# Patient Record
Sex: Male | Born: 1950 | Race: Black or African American | Hispanic: No | Marital: Married | Smoking: Never smoker
Health system: Southern US, Community
[De-identification: ages and names within clinical notes are randomized; demographics above are authoritative.]

## PROBLEM LIST (undated history)

## (undated) DIAGNOSIS — I10 Essential (primary) hypertension: Secondary | ICD-10-CM

---

## 2013-12-03 ENCOUNTER — Observation Stay: Payer: Self-pay | Admitting: Surgery

## 2013-12-03 LAB — COMPREHENSIVE METABOLIC PANEL
ALBUMIN: 3.4 g/dL (ref 3.4–5.0)
ALK PHOS: 75 U/L
ALT: 32 U/L (ref 12–78)
ANION GAP: 7 (ref 7–16)
AST: 42 U/L — AB (ref 15–37)
BILIRUBIN TOTAL: 0.8 mg/dL (ref 0.2–1.0)
BUN: 17 mg/dL (ref 7–18)
CALCIUM: 9.4 mg/dL (ref 8.5–10.1)
CHLORIDE: 95 mmol/L — AB (ref 98–107)
CREATININE: 1.27 mg/dL (ref 0.60–1.30)
Co2: 29 mmol/L (ref 21–32)
EGFR (African American): >60
EGFR (Non-African Amer.): >60
Glucose: 114 mg/dL — ABNORMAL HIGH (ref 65–99)
OSMOLALITY: 265 (ref 275–301)
POTASSIUM: 3.6 mmol/L (ref 3.5–5.1)
Sodium: 131 mmol/L — ABNORMAL LOW (ref 136–145)
TOTAL PROTEIN: 9.4 g/dL — AB (ref 6.4–8.2)

## 2013-12-03 LAB — CBC WITH DIFFERENTIAL/PLATELET
BASOS PCT: 0.7 %
Basophil #: 0.1 10*3/uL (ref 0.0–0.1)
EOS ABS: 0.1 10*3/uL (ref 0.0–0.7)
Eosinophil %: 0.4 %
HCT: 44.4 % (ref 40.0–52.0)
HGB: 14.9 g/dL (ref 13.0–18.0)
Lymphocyte #: 2.4 10*3/uL (ref 1.0–3.6)
Lymphocyte %: 15.9 %
MCH: 31.4 pg (ref 26.0–34.0)
MCHC: 33.5 g/dL (ref 32.0–36.0)
MCV: 94 fL (ref 80–100)
Monocyte #: 1.2 x10 3/mm — ABNORMAL HIGH (ref 0.2–1.0)
Monocyte %: 7.9 %
NEUTROS PCT: 75.1 %
Neutrophil #: 11.4 10*3/uL — ABNORMAL HIGH (ref 1.4–6.5)
PLATELETS: 194 10*3/uL (ref 150–440)
RBC: 4.73 10*6/uL (ref 4.40–5.90)
RDW: 12.8 % (ref 11.5–14.5)
WBC: 15.2 10*3/uL — ABNORMAL HIGH (ref 3.8–10.6)

## 2013-12-03 LAB — URINALYSIS, COMPLETE
Bilirubin,UR: NEGATIVE
GLUCOSE, UR: NEGATIVE mg/dL (ref 0–75)
KETONE: NEGATIVE
Leukocyte Esterase: NEGATIVE
NITRITE: NEGATIVE
Ph: 6 (ref 4.5–8.0)
RBC, UR: NONE SEEN /HPF (ref 0–5)
SPECIFIC GRAVITY: 1.015 (ref 1.003–1.030)

## 2013-12-03 LAB — LIPASE, BLOOD: LIPASE: 112 U/L (ref 73–393)

## 2013-12-04 LAB — BASIC METABOLIC PANEL
Anion Gap: 7 (ref 7–16)
BUN: 15 mg/dL (ref 7–18)
CALCIUM: 8.8 mg/dL (ref 8.5–10.1)
Chloride: 93 mmol/L — ABNORMAL LOW (ref 98–107)
Co2: 30 mmol/L (ref 21–32)
Creatinine: 1.19 mg/dL (ref 0.60–1.30)
EGFR (African American): >60
Glucose: 140 mg/dL — ABNORMAL HIGH (ref 65–99)
Osmolality: 264 (ref 275–301)
Potassium: 4.3 mmol/L (ref 3.5–5.1)
Sodium: 130 mmol/L — ABNORMAL LOW (ref 136–145)

## 2013-12-04 LAB — HEPATIC FUNCTION PANEL A (ARMC)
ALK PHOS: 67 U/L
ALT: 33 U/L (ref 12–78)
AST: 39 U/L — AB (ref 15–37)
Albumin: 2.9 g/dL — ABNORMAL LOW (ref 3.4–5.0)
BILIRUBIN DIRECT: 0.2 mg/dL (ref 0.00–0.20)
Bilirubin,Total: 0.8 mg/dL (ref 0.2–1.0)
Total Protein: 8.4 g/dL — ABNORMAL HIGH (ref 6.4–8.2)

## 2013-12-04 LAB — CBC WITH DIFFERENTIAL/PLATELET
BASOS PCT: 0.4 %
Basophil #: 0.1 10*3/uL (ref 0.0–0.1)
Eosinophil #: 0 10*3/uL (ref 0.0–0.7)
Eosinophil %: 0.2 %
HCT: 40 % (ref 40.0–52.0)
HGB: 13.4 g/dL (ref 13.0–18.0)
Lymphocyte #: 1.6 10*3/uL (ref 1.0–3.6)
Lymphocyte %: 12.8 %
MCH: 31.6 pg (ref 26.0–34.0)
MCHC: 33.6 g/dL (ref 32.0–36.0)
MCV: 94 fL (ref 80–100)
Monocyte #: 1.1 x10 3/mm — ABNORMAL HIGH (ref 0.2–1.0)
Monocyte %: 8.7 %
NEUTROS ABS: 9.8 10*3/uL — AB (ref 1.4–6.5)
NEUTROS PCT: 77.9 %
PLATELETS: 193 10*3/uL (ref 150–440)
RBC: 4.26 10*6/uL — ABNORMAL LOW (ref 4.40–5.90)
RDW: 12.9 % (ref 11.5–14.5)
WBC: 12.6 10*3/uL — AB (ref 3.8–10.6)

## 2013-12-04 LAB — LIPASE, BLOOD: Lipase: 91 U/L (ref 73–393)

## 2013-12-05 LAB — CBC WITH DIFFERENTIAL/PLATELET
Basophil #: 0 10*3/uL (ref 0.0–0.1)
Basophil %: 0.3 %
EOS PCT: 1 %
Eosinophil #: 0.1 10*3/uL (ref 0.0–0.7)
HCT: 37 % — ABNORMAL LOW (ref 40.0–52.0)
HGB: 12.6 g/dL — AB (ref 13.0–18.0)
LYMPHS ABS: 1.7 10*3/uL (ref 1.0–3.6)
Lymphocyte %: 18.2 %
MCH: 31.9 pg (ref 26.0–34.0)
MCHC: 33.9 g/dL (ref 32.0–36.0)
MCV: 94 fL (ref 80–100)
MONOS PCT: 7.4 %
Monocyte #: 0.7 x10 3/mm (ref 0.2–1.0)
NEUTROS ABS: 6.7 10*3/uL — AB (ref 1.4–6.5)
Neutrophil %: 73.1 %
Platelet: 202 10*3/uL (ref 150–440)
RBC: 3.94 10*6/uL — ABNORMAL LOW (ref 4.40–5.90)
RDW: 12.8 % (ref 11.5–14.5)
WBC: 9.2 10*3/uL (ref 3.8–10.6)

## 2013-12-05 LAB — BASIC METABOLIC PANEL
ANION GAP: 5 — AB (ref 7–16)
BUN: 12 mg/dL (ref 7–18)
CALCIUM: 8.4 mg/dL — AB (ref 8.5–10.1)
CO2: 30 mmol/L (ref 21–32)
Chloride: 94 mmol/L — ABNORMAL LOW (ref 98–107)
Creatinine: 1.01 mg/dL (ref 0.60–1.30)
EGFR (African American): >60
EGFR (Non-African Amer.): >60
GLUCOSE: 128 mg/dL — AB (ref 65–99)
OSMOLALITY: 260 (ref 275–301)
POTASSIUM: 4 mmol/L (ref 3.5–5.1)
Sodium: 129 mmol/L — ABNORMAL LOW (ref 136–145)

## 2013-12-05 LAB — HEPATIC FUNCTION PANEL A (ARMC)
Albumin: 2.6 g/dL — ABNORMAL LOW (ref 3.4–5.0)
Alkaline Phosphatase: 60 U/L
Bilirubin, Direct: 0.3 mg/dL — ABNORMAL HIGH (ref 0.00–0.20)
Bilirubin,Total: 0.9 mg/dL (ref 0.2–1.0)
SGOT(AST): 61 U/L — ABNORMAL HIGH (ref 15–37)
SGPT (ALT): 51 U/L (ref 12–78)
Total Protein: 7.9 g/dL (ref 6.4–8.2)

## 2013-12-07 LAB — PATHOLOGY REPORT

## 2014-11-16 NOTE — Op Note (Signed)
PATIENT NAME:  [name], Miguel Guzman DATE OF BIRTH:  [birth year]  DATE OF PROCEDURE:  12/04/2013  PREOPERATIVE DIAGNOSIS: Acute cholecystitis.   POSTOPERATIVE DIAGNOSIS: Acute cholecystitis.   PROCEDURE: Laparoscopic cholecystectomy.   SURGEON: Dr. Quentin Orealph L. Ely.   ANESTHESIA: General.   OPERATIVE PROCEDURE: With the patient in the supine position, after induction of appropriate general anesthesia, the patient's abdomen was prepped with ChloraPrep and draped in sterile towels. The patient was placed in head down, feet up position. A small infraumbilical incision was made in a standard fashion, carried down bluntly through subcutaneous tissue. A Veress needle was used to cannulate the peritoneal cavity. CO2 was insufflated to appropriate pressure measurements. When approximately 2.5 liters of CO2 were instilled, the Veress needle was withdrawn. An 11 mm [employer] port inserted the peritoneal cavity. Intraperitoneal position was confirmed. CO2 was reinsufflated. The patient was placed in the head up, feet down position and rotated slightly to the left side. A subxiphoid transverse incision was made and an 11 mm port inserted under direct vision. Two lateral ports, 5 mm in size, were inserted under direct vision. The gallbladder was partially intrahepatic obviously significantly inflamed with evidence of patchy necrosis. The gallbladder was aspirated of approximately 30 mL to 40 mL of clear bile. The gallbladder was retracted superiorly and laterally exposing the hepatoduodenal ligament. Significant difficulty was encountered with the dissection, but the hepatoduodenal ligament was eventually exposed. The gallbladder extracted laterally and superiorly from the liver exposing the cystic duct. A window was created behind the duct. However, the duct was larger than the millimeter clip. For that reason, the subxiphoid port was exchanged for a 12 mm port and an Endo GIA stapling device inserted  through that port. The stapler was passed behind the duct and stapler then fired. The duct was divided without difficulty. The gallbladder was then dissected free with the hook and cautery apparatus. Once the gallbladder was free, it was captured in the Endo Catch apparatus and removed through the subxiphoid port. The area was copiously irrigated. A drain was placed through the midline port and brought out through one of the lateral stab wounds using a 19-French Blake drain. The drain was secured with 3-0 nylon. The abdomen was then desufflated after irrigation. The upper abdominal incision was closed through the fascia prior to desufflation using suture passer and 0 Vicryl and figure-of-eight suture.  The skin was closed with 5-0 nylon.  The area was infiltrated with 0.25% Marcaine for postoperative pain control.  Sterile dressings were applied.  The patient returned to the recovery room having tolerated the procedure well. Sponge, instrument and needle counts were correct x 2 in the operating room.   ____________________________ Quentin Orealph L. Ely III, MD rle:ce D: 12/04/2013 15:35:23 ET T: 12/04/2013 18:07:11 ET JOB#: [id number]  cc: Quentin Orealph L. Ely III, MD, <Dictator> Quentin OreALPH L ELY MD ELECTRONICALLY SIGNED 12/12/2013 0:09

## 2014-11-16 NOTE — Discharge Summary (Signed)
PATIENT NAME:  Miguel Guzman, Miguel Guzman MR#:  [id number] DATE OF BIRTH:  July 23, 1951  DATE OF ADMISSION:  12/03/2013 DATE OF DISCHARGE:  12/05/2013  BRIEF HISTORY: Miguel Guzman is a 64 year old gentleman admitted through the Emergency Room with signs and symptoms consistent with acute cholecystitis. He had been in his usual state of good health until approximately 4 days prior to admission. He had had long-standing intra-abdominal problems and intermittently evaluated for gastric issues, but this current attack was much more severe than he identified in the past. He was referred by his primary care physician to the ED for further evaluation. Workup in the Emergency Room suggested probable acute cholecystitis. Gallbladder ultrasound demonstrated mild gallbladder wall thickening, a possible impacted cystic duct stone. He was admitted to the hospital, placed on IV antibiotics. After appropriate preoperative preparation and informed consent, he was taken to surgery the following morning where he underwent a laparoscopic cholecystectomy. The gallbladder was acutely inflamed with severe inflammatory change around the porta hepatis. A ductal cholangiogram was not possible. A drain was placed and the gallbladder removed with some difficulty. Postoperatively, he had no significant postoperative problems. He was up ambulating the following morning, tolerating a regular diet. He was discharged home on the 13th to be followed in the office in 7 to 10 days' time. Bathing, activity and driving instructions were given to the patient.   DISCHARGE MEDICATIONS: He is to take Carafate 1 g 4 times a day p.r.n. and Zofran. He was discharged home on Vicodin 5/325 every 4 to 6 hours p.r.n. pain.   FINAL DISCHARGE DIAGNOSIS: Acute cholecystitis.   SURGERY: Laparoscopic cholecystectomy.   ____________________________ Miguel Orealph L. Ely III, MD rle:gb D: 12/12/2013 00:03:40 ET T: 12/12/2013 00:17:10 ET JOB#: [id number]  cc: Carmie Endalph L. Ely III,  MD, <Dictator> Marisue IvanKanhka Linthavong, MD Miguel OreALPH L ELY MD ELECTRONICALLY SIGNED 12/12/2013 20:09

## 2014-11-16 NOTE — H&P (Signed)
PATIENT NAME:  Miguel KinsmanNSLEY, Kazim MR#:  [id number] DATE OF BIRTH:  04-12-1951  DATE OF ADMISSION:  12/03/2013  PRIMARY CARE PHYSICIAN: Dr. Burnadette PopLinthavong.   ADMITTING PHYSICIAN: Dr. Michela PitcherEly.   CHIEF COMPLAINT: Abdominal pain, nausea and vomiting.   BRIEF HISTORY: Miguel Guzman is a 64 year old gentleman seen in the Emergency Room with a 4-day history of significant abdominal discomfort. He has had long-standing abdominal problems and has been intermittently evaluated by his primary care physician. He was treated for "gastric problems" with multiple medications over the last several years. He has had intermittent attacks every 3 to 4 weeks, which last a day or 2, accompanied by abdominal pain, nausea and vomiting. The most recent attack began last week with severe midepigastric, right upper quadrant pain, progressed to nausea and fever. The pain was much worse than he had seen in the past. He called his primary care physician and was referred to the acute care center. He was seen in acute care, treated with GI cocktail without any improvement. He took some codeine at home and did get some improvement in his symptoms. The pain did not improve over the course of the week, and he presented to the Emergency Room today for further evaluation.   He has no other significant GI history other than that noted above. He denies a history of hepatitis, yellow jaundice, pancreatitis, peptic ulcer disease, previous diagnosis of gallbladder disease or diverticulitis. He has had no previous abdominal surgery. He has had a colonoscopy but not an upper endoscopy. He denies any history of cardiac disease, hypertension, diabetes or thyroid problems.   He takes no medications regularly.  HE IS ALLERGIC TO PENICILLIN WHICH CAUSES A RASH.   He is not a cigarette smoker. Does not drink any alcohol.   REVIEW OF SYSTEMS: Otherwise unremarkable.   PHYSICAL EXAMINATION:   GENERAL: He is an alert, pleasant gentleman in obvious  discomfort. VITAL SIGNS: Reveal a blood pressure of 190/87, temperature of 99.2, heart rate of 100. Pain scale of 10.  HEENT: No scleral icterus. No pupillary abnormalities. No facial deformities.  NECK: Supple, nontender with midline trachea. No adenopathy.  CHEST: Clear with no adventitious sounds, and he has normal pulmonary excursion.  CARDIAC: Reveals no murmurs or gallops to my ear. He seems to be in normal sinus rhythm.  ABDOMEN: Soft with some mild right upper quadrant and midepigastric tenderness. He has active bowel sounds. No masses or hernias are noted. EXTREMITIES: Full range of motion, no deformities.  PSYCHIATRIC: Normal orientation, normal affect.  LABORATORY AND RADIOLOGICAL DATA: Laboratory evaluation in the Emergency Room was largely unremarkable. He has a slightly elevated white blood cell count at 15,200. His liver function studies were unremarkable. He was slightly dehydrated with a creatinine of 1.27 and a BUN of 17. Ultrasound demonstrated mild gallbladder wall thickening, possible pericholecystic fluid, and a possible impacted cystic duct stone. Surgical service was consulted.  IMPRESSION AND PLAN: This gentleman has biliary colic, possible acute cholecystitis. Will plan to admit him to the hospital, put him on IV rehydration, antibiotics, pain control, and tentatively plan surgery for tomorrow morning. We discussed this plan with him in detail, and he is in agreement.    ____________________________ Carmie Endalph L. Ely III, MD rle:jcm D: 12/03/2013 14:44:56 ET T: 12/03/2013 15:04:09 ET JOB#: [id number]  cc: Quentin Orealph L. Ely III, MD, <Dictator> Marisue IvanKanhka Linthavong, MD Quentin OreALPH L ELY MD ELECTRONICALLY SIGNED 12/12/2013 0:09

## 2021-08-13 ENCOUNTER — Other Ambulatory Visit: Payer: Self-pay | Admitting: Family Medicine

## 2021-08-13 DIAGNOSIS — I119 Hypertensive heart disease without heart failure: Secondary | ICD-10-CM

## 2021-08-20 ENCOUNTER — Ambulatory Visit
Admission: RE | Admit: 2021-08-20 | Discharge: 2021-08-20 | Disposition: A | Discharge status: Home / Self Care | Payer: No Typology Code available for payment source | Source: Ambulatory Visit | Attending: Family Medicine | Admitting: Family Medicine

## 2021-08-20 DIAGNOSIS — Z87891 Personal history of nicotine dependence: Secondary | ICD-10-CM | POA: Diagnosis not present

## 2021-08-20 DIAGNOSIS — I119 Hypertensive heart disease without heart failure: Secondary | ICD-10-CM | POA: Diagnosis present

## 2021-08-20 LAB — ECHOCARDIOGRAM COMPLETE
AR max vel: 2.39 cm2
AV Area VTI: 2.82 cm2
AV Area mean vel: 2.28 cm2
AV Mean grad: 3 mmHg
AV Peak grad: 5.2 mmHg
Ao pk vel: 1.14 m/s
Area-P 1/2: 4.01 cm2
MV VTI: 2.52 cm2
S' Lateral: 2.57 cm
Single Plane A4C EF: 56.6 %

## 2021-08-20 NOTE — Progress Notes (Signed)
*  PRELIMINARY RESULTS* Echocardiogram 2D Echocardiogram has been performed.  Miguel Guzman Jemmie Ledgerwood 08/20/2021, 9:36 AM

## 2023-05-19 ENCOUNTER — Encounter (HOSPITAL_COMMUNITY)
Admission: EM | Disposition: A | Discharge status: Home / Self Care | Payer: Self-pay | Source: Home / Self Care | Attending: Cardiovascular Disease

## 2023-05-19 ENCOUNTER — Emergency Department (HOSPITAL_COMMUNITY): Payer: Medicare Other

## 2023-05-19 ENCOUNTER — Inpatient Hospital Stay (HOSPITAL_COMMUNITY)
Admission: EM | Admit: 2023-05-19 | Discharge: 2023-05-20 | DRG: 322 | Disposition: A | Discharge status: Home / Self Care | Payer: Medicare Other | Attending: Cardiovascular Disease | Admitting: Cardiovascular Disease

## 2023-05-19 ENCOUNTER — Other Ambulatory Visit: Payer: Self-pay

## 2023-05-19 ENCOUNTER — Encounter (HOSPITAL_COMMUNITY): Payer: Self-pay

## 2023-05-19 DIAGNOSIS — I214 Non-ST elevation (NSTEMI) myocardial infarction: Secondary | ICD-10-CM | POA: Diagnosis not present

## 2023-05-19 DIAGNOSIS — Z79899 Other long term (current) drug therapy: Secondary | ICD-10-CM

## 2023-05-19 DIAGNOSIS — I200 Unstable angina: Secondary | ICD-10-CM | POA: Diagnosis not present

## 2023-05-19 DIAGNOSIS — I161 Hypertensive emergency: Secondary | ICD-10-CM | POA: Diagnosis not present

## 2023-05-19 DIAGNOSIS — I2510 Atherosclerotic heart disease of native coronary artery without angina pectoris: Secondary | ICD-10-CM | POA: Diagnosis present

## 2023-05-19 DIAGNOSIS — I10 Essential (primary) hypertension: Secondary | ICD-10-CM | POA: Diagnosis present

## 2023-05-19 DIAGNOSIS — Z8249 Family history of ischemic heart disease and other diseases of the circulatory system: Secondary | ICD-10-CM

## 2023-05-19 DIAGNOSIS — Z955 Presence of coronary angioplasty implant and graft: Secondary | ICD-10-CM

## 2023-05-19 HISTORY — DX: Essential (primary) hypertension: I10

## 2023-05-19 HISTORY — PX: LEFT HEART CATH AND CORONARY ANGIOGRAPHY: CATH118249

## 2023-05-19 HISTORY — PX: CORONARY STENT INTERVENTION: CATH118234

## 2023-05-19 LAB — CBC
HCT: 35.7 % — ABNORMAL LOW (ref 39.0–52.0)
Hemoglobin: 12.1 g/dL — ABNORMAL LOW (ref 13.0–17.0)
MCH: 31 pg (ref 26.0–34.0)
MCHC: 33.9 g/dL (ref 30.0–36.0)
MCV: 91.5 fL (ref 80.0–100.0)
Platelets: 126 10*3/uL — ABNORMAL LOW (ref 150–400)
RBC: 3.9 MIL/uL — ABNORMAL LOW (ref 4.22–5.81)
RDW: 12.4 % (ref 11.5–15.5)
WBC: 7.9 10*3/uL (ref 4.0–10.5)
nRBC: 0 % (ref 0.0–0.2)

## 2023-05-19 LAB — HEPATIC FUNCTION PANEL
ALT: 13 U/L (ref 0–44)
AST: 24 U/L (ref 15–41)
Albumin: 3.4 g/dL — ABNORMAL LOW (ref 3.5–5.0)
Alkaline Phosphatase: 57 U/L (ref 38–126)
Bilirubin, Direct: 0.1 mg/dL (ref 0.0–0.2)
Indirect Bilirubin: 0.6 mg/dL (ref 0.3–0.9)
Total Bilirubin: 0.7 mg/dL (ref 0.3–1.2)
Total Protein: 7.5 g/dL (ref 6.5–8.1)

## 2023-05-19 LAB — BRAIN NATRIURETIC PEPTIDE: B Natriuretic Peptide: 227.9 pg/mL — ABNORMAL HIGH (ref 0.0–100.0)

## 2023-05-19 LAB — BASIC METABOLIC PANEL
Anion gap: 10 (ref 5–15)
BUN: 7 mg/dL — ABNORMAL LOW (ref 8–23)
CO2: 26 mmol/L (ref 22–32)
Calcium: 9 mg/dL (ref 8.9–10.3)
Chloride: 101 mmol/L (ref 98–111)
Creatinine, Ser: 1.11 mg/dL (ref 0.61–1.24)
GFR, Estimated: >60 mL/min (ref >60)
Glucose, Bld: 167 mg/dL — ABNORMAL HIGH (ref 70–99)
Potassium: 3.2 mmol/L — ABNORMAL LOW (ref 3.5–5.1)
Sodium: 137 mmol/L (ref 135–145)

## 2023-05-19 LAB — TROPONIN I (HIGH SENSITIVITY)
Troponin I (High Sensitivity): 16 ng/L (ref <18)
Troponin I (High Sensitivity): 68 ng/L — ABNORMAL HIGH (ref <18)
Troponin I (High Sensitivity): 461 ng/L (ref <18)

## 2023-05-19 LAB — POCT ACTIVATED CLOTTING TIME
Activated Clotting Time: 342 s
Activated Clotting Time: 593 s

## 2023-05-19 LAB — D-DIMER, QUANTITATIVE: D-Dimer, Quant: 0.88 ug{FEU}/mL — ABNORMAL HIGH (ref 0.00–0.50)

## 2023-05-19 LAB — CBG MONITORING, ED: Glucose-Capillary: 139 mg/dL — ABNORMAL HIGH (ref 70–99)

## 2023-05-19 LAB — MRSA NEXT GEN BY PCR, NASAL: MRSA by PCR Next Gen: NOT DETECTED

## 2023-05-19 SURGERY — LEFT HEART CATH AND CORONARY ANGIOGRAPHY
Anesthesia: LOCAL

## 2023-05-19 MED ORDER — ROSUVASTATIN CALCIUM 20 MG PO TABS
40.0000 mg | ORAL_TABLET | Freq: Every day | ORAL | Status: DC
  Administered 2023-05-19 – 2023-05-20 (×2): 40 mg via ORAL
  Filled 2023-05-19 (×2): qty 2

## 2023-05-19 MED ORDER — ROSUVASTATIN CALCIUM 20 MG PO TABS: 40.0000 mg | ORAL_TABLET | Freq: Every day | ORAL | Status: DC

## 2023-05-19 MED ORDER — LABETALOL HCL 5 MG/ML IV SOLN
10.0000 mg | INTRAVENOUS | Status: AC | PRN
Start: 2023-05-19 — End: 2023-05-20

## 2023-05-19 MED ORDER — TICAGRELOR 90 MG PO TABS
ORAL_TABLET | ORAL | Status: DC | PRN
  Administered 2023-05-19: 180 mg via ORAL

## 2023-05-19 MED ORDER — SODIUM CHLORIDE 0.9 % WEIGHT BASED INFUSION: 3.0000 mL/kg/h | INTRAVENOUS | Status: DC

## 2023-05-19 MED ORDER — FENTANYL CITRATE (PF) 100 MCG/2ML IJ SOLN
INTRAMUSCULAR | Status: AC
  Filled 2023-05-19: qty 2

## 2023-05-19 MED ORDER — ACETAMINOPHEN 325 MG PO TABS
650.0000 mg | ORAL_TABLET | ORAL | Status: DC | PRN
Start: 2023-05-19 — End: 2023-05-20

## 2023-05-19 MED ORDER — SODIUM CHLORIDE 0.9% FLUSH: 3.0000 mL | INTRAVENOUS | Status: DC | PRN

## 2023-05-19 MED ORDER — VERAPAMIL HCL 2.5 MG/ML IV SOLN
INTRAVENOUS | Status: DC | PRN
  Administered 2023-05-19: 10 mL via INTRA_ARTERIAL

## 2023-05-19 MED ORDER — SODIUM CHLORIDE 0.9 % WEIGHT BASED INFUSION
1.0000 mL/kg/h | INTRAVENOUS | Status: DC
  Administered 2023-05-19: 1 mL/kg/h via INTRAVENOUS

## 2023-05-19 MED ORDER — NITROGLYCERIN IN D5W 200-5 MCG/ML-% IV SOLN
0.0000 ug/min | INTRAVENOUS | Status: DC
Start: 2023-05-19 — End: 2023-05-19
  Administered 2023-05-19: 5 ug/min via INTRAVENOUS
  Filled 2023-05-19 (×2): qty 250

## 2023-05-19 MED ORDER — NITROGLYCERIN 1 MG/10 ML FOR IR/CATH LAB
INTRA_ARTERIAL | Status: AC
  Filled 2023-05-19: qty 10

## 2023-05-19 MED ORDER — IOHEXOL 350 MG/ML SOLN
INTRAVENOUS | Status: DC | PRN
  Administered 2023-05-19: 130 mL via INTRA_ARTERIAL

## 2023-05-19 MED ORDER — MORPHINE SULFATE (PF) 2 MG/ML IV SOLN
2.0000 mg | Freq: Once | INTRAVENOUS | Status: AC
  Administered 2023-05-19: 2 mg via INTRAVENOUS
  Filled 2023-05-19: qty 1

## 2023-05-19 MED ORDER — VERAPAMIL HCL 2.5 MG/ML IV SOLN
INTRAVENOUS | Status: AC
  Filled 2023-05-19: qty 2

## 2023-05-19 MED ORDER — HYDRALAZINE HCL 20 MG/ML IJ SOLN
20.0000 mg | Freq: Once | INTRAMUSCULAR | Status: AC
  Administered 2023-05-19: 20 mg via INTRAVENOUS
  Filled 2023-05-19: qty 1

## 2023-05-19 MED ORDER — NITROGLYCERIN 0.4 MG SL SUBL
0.4000 mg | SUBLINGUAL_TABLET | SUBLINGUAL | Status: DC | PRN
  Administered 2023-05-19 (×3): 0.4 mg via SUBLINGUAL
  Filled 2023-05-19 (×3): qty 1

## 2023-05-19 MED ORDER — SODIUM CHLORIDE 0.9% FLUSH
3.0000 mL | Freq: Two times a day (BID) | INTRAVENOUS | Status: DC
  Administered 2023-05-20: 3 mL via INTRAVENOUS

## 2023-05-19 MED ORDER — HEPARIN (PORCINE) 25000 UT/250ML-% IV SOLN
1100.0000 [IU]/h | INTRAVENOUS | Status: DC
  Administered 2023-05-19: 1100 [IU]/h via INTRAVENOUS
  Filled 2023-05-19: qty 250

## 2023-05-19 MED ORDER — ROSUVASTATIN CALCIUM 20 MG PO TABS: 20.0000 mg | ORAL_TABLET | Freq: Every day | ORAL | Status: DC

## 2023-05-19 MED ORDER — ASPIRIN 81 MG PO TBEC
81.0000 mg | DELAYED_RELEASE_TABLET | Freq: Every day | ORAL | Status: DC
  Administered 2023-05-20: 81 mg via ORAL
  Filled 2023-05-19: qty 1

## 2023-05-19 MED ORDER — HEPARIN SODIUM (PORCINE) 1000 UNIT/ML IJ SOLN
INTRAMUSCULAR | Status: DC | PRN
  Administered 2023-05-19 (×2): 5000 [IU] via INTRAVENOUS

## 2023-05-19 MED ORDER — MIDAZOLAM HCL 2 MG/2ML IJ SOLN
INTRAMUSCULAR | Status: AC
  Filled 2023-05-19: qty 2

## 2023-05-19 MED ORDER — TICAGRELOR 90 MG PO TABS
90.0000 mg | ORAL_TABLET | Freq: Two times a day (BID) | ORAL | Status: DC
  Administered 2023-05-20: 90 mg via ORAL
  Filled 2023-05-19: qty 1

## 2023-05-19 MED ORDER — NITROGLYCERIN 1 MG/10 ML FOR IR/CATH LAB
INTRA_ARTERIAL | Status: DC | PRN
  Administered 2023-05-19 (×2): 150 ug via INTRACORONARY

## 2023-05-19 MED ORDER — TICAGRELOR 90 MG PO TABS
ORAL_TABLET | ORAL | Status: AC
  Filled 2023-05-19: qty 2

## 2023-05-19 MED ORDER — HEPARIN SODIUM (PORCINE) 1000 UNIT/ML IJ SOLN
INTRAMUSCULAR | Status: AC
Start: 2023-05-19 — End: ?
  Filled 2023-05-19: qty 10

## 2023-05-19 MED ORDER — SODIUM CHLORIDE 0.9 % WEIGHT BASED INFUSION: 1.0000 mL/kg/h | INTRAVENOUS | Status: DC

## 2023-05-19 MED ORDER — LIDOCAINE HCL (PF) 1 % IJ SOLN
INTRAMUSCULAR | Status: DC | PRN
  Administered 2023-05-19: 2 mL

## 2023-05-19 MED ORDER — ONDANSETRON HCL 4 MG/2ML IJ SOLN
4.0000 mg | Freq: Once | INTRAMUSCULAR | Status: AC
  Administered 2023-05-19: 4 mg via INTRAVENOUS
  Filled 2023-05-19: qty 2

## 2023-05-19 MED ORDER — MIDAZOLAM HCL 2 MG/2ML IJ SOLN
INTRAMUSCULAR | Status: DC | PRN
  Administered 2023-05-19: 1 mg via INTRAVENOUS
  Administered 2023-05-19: 2 mg via INTRAVENOUS

## 2023-05-19 MED ORDER — POTASSIUM CHLORIDE CRYS ER 20 MEQ PO TBCR
40.0000 meq | EXTENDED_RELEASE_TABLET | Freq: Once | ORAL | Status: AC
  Administered 2023-05-19: 40 meq via ORAL
  Filled 2023-05-19: qty 2

## 2023-05-19 MED ORDER — CARVEDILOL 6.25 MG PO TABS
6.2500 mg | ORAL_TABLET | Freq: Two times a day (BID) | ORAL | Status: DC
  Administered 2023-05-19 – 2023-05-20 (×2): 6.25 mg via ORAL
  Filled 2023-05-19: qty 2
  Filled 2023-05-19: qty 1

## 2023-05-19 MED ORDER — LIDOCAINE HCL (PF) 1 % IJ SOLN
INTRAMUSCULAR | Status: AC
Start: 2023-05-19 — End: ?
  Filled 2023-05-19: qty 30

## 2023-05-19 MED ORDER — HEPARIN (PORCINE) IN NACL 1000-0.9 UT/500ML-% IV SOLN
INTRAVENOUS | Status: DC | PRN
  Administered 2023-05-19: 1000 mL via INTRA_ARTERIAL

## 2023-05-19 MED ORDER — HEPARIN BOLUS VIA INFUSION
4000.0000 [IU] | Freq: Once | INTRAVENOUS | Status: AC
  Administered 2023-05-19: 4000 [IU] via INTRAVENOUS
  Filled 2023-05-19: qty 4000

## 2023-05-19 MED ORDER — SODIUM CHLORIDE 0.9 % IV SOLN: 250.0000 mL | INTRAVENOUS | Status: DC | PRN

## 2023-05-19 MED ORDER — ONDANSETRON HCL 4 MG/2ML IJ SOLN: 4.0000 mg | Freq: Four times a day (QID) | INTRAMUSCULAR | Status: DC | PRN

## 2023-05-19 MED ORDER — FENTANYL CITRATE (PF) 100 MCG/2ML IJ SOLN
INTRAMUSCULAR | Status: DC | PRN
  Administered 2023-05-19: 50 ug via INTRAVENOUS
  Administered 2023-05-19: 25 ug via INTRAVENOUS

## 2023-05-19 MED ORDER — HYDRALAZINE HCL 20 MG/ML IJ SOLN: 10.0000 mg | INTRAMUSCULAR | Status: AC | PRN

## 2023-05-19 MED ORDER — SODIUM CHLORIDE 0.9 % WEIGHT BASED INFUSION
1.0000 mL/kg/h | INTRAVENOUS | Status: AC
  Administered 2023-05-19: 1 mL/kg/h via INTRAVENOUS

## 2023-05-19 SURGICAL SUPPLY — 24 items
BALLN EMERGE MR 2.5X12 (BALLOONS) ×1
BALLN ~~LOC~~ EMERGE MR 3.75X8 (BALLOONS) ×1
BALLOON EMERGE MR 2.5X12 (BALLOONS) IMPLANT
BALLOON ~~LOC~~ EMERGE MR 3.75X8 (BALLOONS) IMPLANT
CATH 5FR JL3.5 JR4 ANG PIG MP (CATHETERS) IMPLANT
CATH VISTA GUIDE 6FR JR4 (CATHETERS) IMPLANT
DEVICE RAD COMP TR BAND LRG (VASCULAR PRODUCTS) IMPLANT
ELECT DEFIB PAD ADLT CADENCE (PAD) IMPLANT
GLIDESHEATH SLEND SS 6F .021 (SHEATH) IMPLANT
GUIDEWIRE ANGLED .035X150CM (WIRE) IMPLANT
GUIDEWIRE INQWIRE 1.5J.035X260 (WIRE) IMPLANT
INQWIRE 1.5J .035X260CM (WIRE) ×1
KIT ENCORE 26 ADVANTAGE (KITS) IMPLANT
KIT SYRINGE INJ CVI SPIKEX1 (MISCELLANEOUS) IMPLANT
PACK CARDIAC CATHETERIZATION (CUSTOM PROCEDURE TRAY) ×1 IMPLANT
PROTECTION STATION PRESSURIZED (MISCELLANEOUS) ×1
SET ATX-X65L (MISCELLANEOUS) IMPLANT
STATION PROTECTION PRESSURIZED (MISCELLANEOUS) IMPLANT
STENT SYNERGY XD 2.75X16 (Permanent Stent) IMPLANT
STENT SYNERGY XD 3.50X12 (Permanent Stent) IMPLANT
SYNERGY XD 2.75X16 (Permanent Stent) ×1 IMPLANT
SYNERGY XD 3.50X12 (Permanent Stent) ×1 IMPLANT
TUBING CIL FLEX 10 FLL-RA (TUBING) IMPLANT
WIRE RUNTHROUGH .014X180CM (WIRE) IMPLANT

## 2023-05-19 NOTE — Progress Notes (Signed)
PHARMACY - ANTICOAGULATION CONSULT NOTE  Pharmacy Consult for heparin Indication: chest pain/ACS  Allergies  Allergen Reactions   Penicillins Hives    Patient Measurements:    Vital Signs: Temp: 97.8 F (36.6 C) (10/24 0924) Temp Source: Oral (10/24 0924) BP: 143/91 (10/24 1130) Pulse Rate: 74 (10/24 1130)  Labs: Recent Labs    05/19/23 0936 05/19/23 1139  HGB 12.1*  --   HCT 35.7*  --   PLT 126*  --   TROPONINIHS 16 68*    CrCl cannot be calculated (Patient's most recent lab result is older than the maximum 21 days allowed.).   Medical History: No past medical history on file.    Assessment: 67 YOM presenting with SOB, CP, elevated troponin, he is not on anticoagulation PTA  Goal of Therapy:  Heparin level 0.3-0.7 units/ml Monitor platelets by anticoagulation protocol: Yes   Plan:  Heparin 4000 units IV  1, and gtt at 1100 units/hr F/u 8 hour heparin level F/u cards eval and recs  Daylene Posey, PharmD, [facility] Clinical Pharmacist ED Pharmacist Phone # [phone] 05/19/2023 12:41 PM

## 2023-05-19 NOTE — Interval H&P Note (Signed)
History and Physical Interval Note:  05/19/2023 4:08 PM  Miguel Guzman  has presented today for surgery, with the diagnosis of chest pain.  The various methods of treatment have been discussed with the patient and family. After consideration of risks, benefits and other options for treatment, the patient has consented to  Procedure(s): LEFT HEART CATH AND CORONARY ANGIOGRAPHY (N/A) as a surgical intervention.  The patient's history has been reviewed, patient examined, no change in status, stable for surgery.  I have reviewed the patient's chart and labs.  Questions were answered to the patient's satisfaction.     Tonny Bollman

## 2023-05-19 NOTE — Plan of Care (Signed)
  Problem: Education: Goal: Understanding of cardiac disease, CV risk reduction, and recovery process will improve Outcome: Progressing Goal: Individualized Educational Video(s) Outcome: Progressing   Problem: Activity: Goal: Ability to tolerate increased activity will improve Outcome: Progressing   Problem: Cardiac: Goal: Ability to achieve and maintain adequate cardiovascular perfusion will improve Outcome: Progressing   Problem: Health Behavior/Discharge Planning: Goal: Ability to safely manage health-related needs after discharge will improve Outcome: Progressing   Problem: Education: Goal: Knowledge of General Education information will improve Description: Including pain rating scale, medication(s)/side effects and non-pharmacologic comfort measures Outcome: Progressing   Problem: Health Behavior/Discharge Planning: Goal: Ability to manage health-related needs will improve Outcome: Progressing   Problem: Clinical Measurements: Goal: Ability to maintain clinical measurements within normal limits will improve Outcome: Progressing Goal: Will remain free from infection Outcome: Progressing Goal: Diagnostic test results will improve Outcome: Progressing Goal: Respiratory complications will improve Outcome: Progressing Goal: Cardiovascular complication will be avoided Outcome: Progressing   Problem: Activity: Goal: Risk for activity intolerance will decrease Outcome: Progressing   Problem: Nutrition: Goal: Adequate nutrition will be maintained Outcome: Progressing   Problem: Coping: Goal: Level of anxiety will decrease Outcome: Progressing   Problem: Elimination: Goal: Will not experience complications related to bowel motility Outcome: Progressing Goal: Will not experience complications related to urinary retention Outcome: Progressing   Problem: Pain Management: Goal: General experience of comfort will improve Outcome: Progressing   Problem:  Safety: Goal: Ability to remain free from injury will improve Outcome: Progressing   Problem: Skin Integrity: Goal: Risk for impaired skin integrity will decrease Outcome: Progressing   Problem: Education: Goal: Understanding of CV disease, CV risk reduction, and recovery process will improve Outcome: Progressing Goal: Individualized Educational Video(s) Outcome: Progressing   Problem: Activity: Goal: Ability to return to baseline activity level will improve Outcome: Progressing   Problem: Cardiovascular: Goal: Ability to achieve and maintain adequate cardiovascular perfusion will improve Outcome: Progressing Goal: Vascular access site(s) Level 0-1 will be maintained Outcome: Progressing   Problem: Health Behavior/Discharge Planning: Goal: Ability to safely manage health-related needs after discharge will improve Outcome: Progressing

## 2023-05-19 NOTE — ED Provider Notes (Signed)
[place] EMERGENCY DEPARTMENT AT [facility] Provider Note   CSN: [id number] Arrival date & time: 05/19/23  4782     History  Chief Complaint  Patient presents with   Shortness of Breath   Shoulder Pain   Hypertension    Miguel Guzman is a 72 y.o. male.  HPI 72 year old male presents today complaining of chest pain.  He states he was in his usual state of good health this morning when he stopped at [employer] on the way to work.  After arrival at work he felt like he was having severe chest pain.  He states it was a 6 out of 10.  It was in the anterior substernal area.  Initially he did not have radiation but has begun having pain in his lower arm.  He states that it felt worse with exertion and he felt dyspneic.  He denies any previous similar events.  He states that he has been noted to have high blood pressure in the past and was on medication for a period of time but it resolved and he has not needed any further medication.  He states that he follows with his primary care doctor but has had no known definite medical problems.  He has previously had his gallbladder out.  He denies tobacco and alcohol use.     Home Medications Prior to Admission medications   Not on File      Allergies    Penicillins    Review of Systems   Review of Systems  Physical Exam Updated Vital Signs BP (!) 141/69   Pulse 69   Temp 98.1 F (36.7 C)   Resp 16   SpO2 100%  Physical Exam Vitals reviewed.  HENT:     Head: Normocephalic and atraumatic.     Mouth/Throat:     Mouth: Mucous membranes are moist.  Eyes:     Pupils: Pupils are equal, round, and reactive to light.  Cardiovascular:     Rate and Rhythm: Normal rate and regular rhythm.  Pulmonary:     Effort: Pulmonary effort is normal.     Breath sounds: Normal breath sounds.  Chest:     Chest wall: No mass, deformity or tenderness.  Musculoskeletal:        General: Normal range of motion.     Cervical back: Normal  range of motion.     Right lower leg: No tenderness. No edema.     Left lower leg: No tenderness. No edema.  Skin:    General: Skin is warm and dry.     Capillary Refill: Capillary refill takes less than 2 seconds.  Neurological:     General: No focal deficit present.     Mental Status: He is alert.  Psychiatric:        Mood and Affect: Mood normal.     ED Results / Procedures / Treatments   Labs (all labs ordered are listed, but only abnormal results are displayed) Labs Reviewed  CBC - Abnormal; Notable for the following components:      Result Value   RBC 3.90 (*)    Hemoglobin 12.1 (*)    HCT 35.7 (*)    Platelets 126 (*)    All other components within normal limits  D-DIMER, QUANTITATIVE - Abnormal; Notable for the following components:   D-Dimer, Quant 0.88 (*)    All other components within normal limits  HEPATIC FUNCTION PANEL - Abnormal; Notable for the following components:  Albumin 3.4 (*)    All other components within normal limits  BASIC METABOLIC PANEL - Abnormal; Notable for the following components:   Potassium 3.2 (*)    Glucose, Bld 167 (*)    BUN 7 (*)    All other components within normal limits  CBG MONITORING, ED - Abnormal; Notable for the following components:   Glucose-Capillary 139 (*)    All other components within normal limits  TROPONIN I (HIGH SENSITIVITY) - Abnormal; Notable for the following components:   Troponin I (High Sensitivity) 68 (*)    All other components within normal limits  BASIC METABOLIC PANEL  HEPATIC FUNCTION PANEL  HEPARIN LEVEL (UNFRACTIONATED)  BRAIN NATRIURETIC PEPTIDE  TROPONIN I (HIGH SENSITIVITY)  TROPONIN I (HIGH SENSITIVITY)    EKG EKG Interpretation Date/Time:  Thursday May 19 2023 09:20:31 EDT Ventricular Rate:  55 PR Interval:  162 QRS Duration:  74 QT Interval:  490 QTC Calculation: 469 R Axis:   66  Text Interpretation: Sinus rhythm Left ventricular hypertrophy rate has increased since  first prior traing of 03 Dec 2013 Confirmed by Margarita Grizzle [phone]) on 05/19/2023 10:00:33 AM  EKG Interpretation Date/Time:  Thursday May 19 2023 12:10:06 EDT Ventricular Rate:  82 PR Interval:  166 QRS Duration:  74 QT Interval:  374 QTC Calculation: 437 R Axis:   73  Text Interpretation: Sinus rhythm LAE, consider biatrial enlargement Probable LVH with secondary repol abnrm ST depression, consider ischemia, diffuse lds Confirmed by Margarita Grizzle [phone]) on 05/19/2023 12:20:59 PM         Radiology DG Chest Portable 1 View  Result Date: 05/19/2023 CLINICAL DATA:  Shortness of breath. EXAM: PORTABLE CHEST 1 VIEW COMPARISON:  None Available. FINDINGS: Bilateral lung fields are clear. Note is made of elevated right hemidiaphragm. Bilateral lateral costophrenic angles are clear. Normal cardio-mediastinal silhouette. No acute osseous abnormalities. The soft tissues are within normal limits. IMPRESSION: No acute cardiopulmonary abnormality. Electronically Signed   By: Jules Schick M.D.   On: 05/19/2023 10:58    Procedures .Critical Care  Performed by: Margarita Grizzle, MD Authorized by: Margarita Grizzle, MD   Critical care provider statement:    Critical care time (minutes):  60   Critical care was time spent personally by me on the following activities:  Development of treatment plan with patient or surrogate, discussions with consultants, evaluation of patient's response to treatment, examination of patient, ordering and review of laboratory studies, ordering and review of radiographic studies, ordering and performing treatments and interventions, pulse oximetry, re-evaluation of patient's condition and review of old charts     Medications Ordered in ED Medications  nitroGLYCERIN (NITROSTAT) SL tablet 0.4 mg (0.4 mg Sublingual Given 05/19/23 1017)  heparin ADULT infusion 100 units/mL (25000 units/256mL) (1,100 Units/hr Intravenous New Bag/Given 05/19/23 1416)  nitroGLYCERIN 50 mg  in dextrose 5 % 250 mL (0.2 mg/mL) infusion (10 mcg/min Intravenous Rate/Dose Change 05/19/23 1543)  carvedilol (COREG) tablet 6.25 mg (6.25 mg Oral Given 05/19/23 1442)  aspirin EC tablet 81 mg (has no administration in time range)  acetaminophen (TYLENOL) tablet 650 mg (has no administration in time range)  ondansetron (ZOFRAN) injection 4 mg (has no administration in time range)  0.9% sodium chloride infusion (has no administration in time range)    Followed by  0.9% sodium chloride infusion (has no administration in time range)  rosuvastatin (CRESTOR) tablet 40 mg (40 mg Oral Given 05/19/23 1442)  potassium chloride SA (KLOR-CON M) CR tablet 40 mEq (has no  administration in time range)  morphine (PF) 2 MG/ML injection 2 mg (2 mg Intravenous Given 05/19/23 1112)  hydrALAZINE (APRESOLINE) injection 20 mg (20 mg Intravenous Given 05/19/23 1112)  ondansetron (ZOFRAN) injection 4 mg (4 mg Intravenous Given 05/19/23 1225)  heparin bolus via infusion 4,000 Units (4,000 Units Intravenous Bolus from Bag 05/19/23 1416)    ED Course/ Medical Decision Making/ A&P Clinical Course as of 05/19/23 1608  Thu May 19, 2023  0957 X-Nakshatra Klose reviewed interpreted and within normal limits [DR]  1027 CBC reviewed interpreted within normal limits Glucose 139 [DR]  1050 Patient had sublingual nitroglycerin x 3. Reports that his pain is still a 4 out of 10 and pain in the left arm to elbow is continued and the worst of his pain. He continues to be hypertensive with systolic blood pressure of 210 after the nitroglycerin. 2 mg of morphine ordered Troponin reviewed interpreted within normal limits at 16 D-dimer added [DR]  1051 Hydralazine ordered [DR]  1052 Repeat EKG ordered [DR]  1215 Patient's pain intermittently responded to nitro but has again worsened.  Repeat EKG obtained with diffuse ST depressions in inferior lateral leads which is changed from prior Consult to cardiology placed [DR]  1215 Blood pressure  has normalized at 143/90 with pain medicine and hydralazine [DR]  1234 Repeat troponin has increased from 16-68 [DR]    Clinical Course User Index [DR] Margarita Grizzle, MD                                 Medical Decision Making Amount and/or Complexity of Data Reviewed Labs: ordered. Radiology: ordered.  Risk Prescription drug management. Decision regarding hospitalization.   72 year old male presents today with chest and arm pain.  EKG with nonspecific ST depression.  Patient with troponin 16 rises to 68 on second troponin Patient is treated here with nitroglycerin, patient treated prehospital with aspirin, patient was hypertensive and received hydralazine.  He also received 2 mg of morphine.  He is placed on nitro and heparin drip. Discussed care with cardiology who is seen at bedside and plans to take to cardiac catheterization        Final Clinical Impression(s) / ED Diagnoses Final diagnoses:  NSTEMI (non-ST elevated myocardial infarction) [facility])    Rx / DC Orders ED Discharge Orders     None         Margarita Grizzle, MD 05/19/23 [phone]

## 2023-05-19 NOTE — ED Triage Notes (Signed)
Patient arrives by EMS was on his way to work this morning, and reports when he got out of his car and was walking into work started feeling really short of breath, dizzy and nauseous.  Patient report pain to chest that radiated into left shoulder.   Patient vomited once and reported some relief.   Patient given 324 ASA and was given 1 SL nitroglycerin.  On arrival patient reports pain to left arm.

## 2023-05-19 NOTE — H&P (Signed)
Cardiology Admission History and Physical:  Patient ID: Miguel Guzman MRN: [id number] DOB: April 29, 1951  Admit date: 05/19/2023  Primary Care Provider: [facility] Primary Cardiologist: None  Primary Electrophysiologist:  None   Chief Complaint:  Chest pain  Patient Profile:  Miguel Guzman is a 72 y.o. male with HTN who presents with acute chest pain and NSTEMI.   History of Present Illness:  [name] presents with acute chest pain that began this morning while at work.  He tells me he ate breakfast at 6:30 AM at [employer].  He tells me when he got to work he climbed a flight of stairs and developed burning pressure in his chest as well as shortness of breath.  Symptoms did not resolve.  He was then brought to [facility] by ambulance.  He reports no fevers or chills.  He tells me he actually takes no medications.  He has not seen a primary care doctor in quite some time.  He tells me he was told he was healthy.  I was able to review records.  He was seen by a [facility] primary care physician in [place] in 2019.  He was prescribed losartan for hypertension.  He does not appear to be taking this.  On arrival to [facility], blood pressure severely elevated at 215/97.  He tells me this is due to 10 out of 10 pressure in his chest.  He also reports vomiting.  Blood pressure at the time of my examination is 143/91.  He was given hydralazine and nitroglycerin.  Symptoms are starting to improve.  He tells me that his blood pressure is normally controlled.  He says his blood pressure is elevated due to pain.  His chest x-ray is normal.  His EKG shows sinus rhythm heart rate 82 with likely LVH but he does have deep inferolateral T wave inversions that are horizontal.  Emergency medicine physicians were starting heparin drip and have informed him to start a nitroglycerin drip.  CV exam unremarkable.  Heart Pathway Score:       Past Medical History: Past Medical  History:  Diagnosis Date   Hypertension     Past Surgical History: History reviewed. No pertinent surgical history.   Medications Prior to Admission: Prior to Admission medications   Not on File     Allergies:    Allergies  Allergen Reactions   Penicillins Hives    Social History:   Social History   Socioeconomic History   Marital status: Married    Spouse name: Not on file   Number of children: Not on file   Years of education: Not on file   Highest education level: Not on file  Occupational History   Not on file  Tobacco Use   Smoking status: Never   Smokeless tobacco: Never  Substance and Sexual Activity   Alcohol use: Never   Drug use: Never   Sexual activity: Not on file  Other Topics Concern   Not on file  Social History Narrative   Not on file   Social Determinants of Health   Financial Resource Strain: Not on file  Food Insecurity: Not on file  Transportation Needs: Not on file  Physical Activity: Not on file  Stress: Not on file  Social Connections: Not on file  Intimate Partner Violence: Not on file     Family History:   The patient's family history includes Hypertension in his father and mother.    ROS:  All other ROS reviewed and negative. Pertinent positives noted in the HPI.     Physical Exam/Data:   Vitals:   05/19/23 1030 05/19/23 1118 05/19/23 1130 05/19/23 1322  BP: (!) 210/102 (!) 201/116 (!) 143/91 (!) 154/66  Pulse: 60 65 74 83  Resp: 18 19 19 17   Temp:    98.1 F (36.7 C)  TempSrc:      SpO2: 99% 100% 100% 100%   No intake or output data in the 24 hours ending 05/19/23 1327      No data to display          There is no height or weight on file to calculate BMI.  General: Well nourished, well developed, in no acute distress Head: Atraumatic, normal size  Eyes: PEERLA, EOMI  Neck: Supple, no JVD Endocrine: No thryomegaly Cardiac: Normal S1, S2; RRR; no murmurs, rubs, or gallops Lungs: Clear to auscultation  bilaterally, no wheezing, rhonchi or rales  Abd: Soft, nontender, no hepatomegaly  Ext: No edema, pulses 2+ Musculoskeletal: No deformities, BUE and BLE strength normal and equal Skin: Warm and dry, no rashes   Neuro: Alert and oriented to person, place, time, and situation, CNII-XII grossly intact, no focal deficits  Psych: Normal mood and affect   EKG:  The ECG that was done was personally reviewed and demonstrates SR 82, LVH, inferolateral TWI  Relevant CV Studies: Echo pending   Laboratory Data: High Sensitivity Troponin:   Recent Labs  Lab 05/19/23 0936 05/19/23 1139  TROPONINIHS 16 68*      Cardiac EnzymesNo results for input(s): "TROPONINI" in the last 168 hours. No results for input(s): "TROPIPOC" in the last 168 hours.  Chemistry Recent Labs  Lab 05/19/23 0936  NA SPECIMEN HEMOLYZED. HEMOLYSIS MAY AFFECT INTEGRITY OF RESULTS.    No results for input(s): "PROT", "ALBUMIN", "AST", "ALT", "ALKPHOS", "BILITOT" in the last 168 hours. Hematology Recent Labs  Lab 05/19/23 0936  WBC 7.9  RBC 3.90*  HGB 12.1*  HCT 35.7*  MCV 91.5  MCH 31.0  MCHC 33.9  RDW 12.4  PLT 126*   BNPNo results for input(s): "BNP", "PROBNP" in the last 168 hours.  DDimer  Recent Labs  Lab 05/19/23 1139  DDIMER 0.88*    Radiology/Studies:  DG Chest Portable 1 View  Result Date: 05/19/2023 CLINICAL DATA:  Shortness of breath. EXAM: PORTABLE CHEST 1 VIEW COMPARISON:  None Available. FINDINGS: Bilateral lung fields are clear. Note is made of elevated right hemidiaphragm. Bilateral lateral costophrenic angles are clear. Normal cardio-mediastinal silhouette. No acute osseous abnormalities. The soft tissues are within normal limits. IMPRESSION: No acute cardiopulmonary abnormality. Electronically Signed   By: Jules Schick M.D.   On: 05/19/2023 10:58    Assessment and Plan:   # NSTEMI # SOB -Presents with abrupt symptoms of chest discomfort.  Troponins are trending up.  Still having 8  out of 10 burning in his chest.  Blood pressure currently in the 140s. -He informed me that his blood pressure is spiking due to pain.  He tells me that he does not have a diagnosis of hypertension that is managed in the outpatient setting. -Unclear if this is primary ACS or demand in the setting of accelerated hypertension.  Regardless we will give him aspirin and start him on a heparin drip.  Start Crestor 40 mg daily.  We will also start nitroglycerin drip.  We discussed proceeding with left heart catheterization however his BMP is pending. -He will need A1c, lipids, LP(a), TSH,  BNP.  -He denies tobacco abuse, alcohol use or drug use.  # HTN -Unclear if pain is driving his blood pressure or blood pressure driving a demand event.  Regardless we will start him on a nitroglycerin drip.  He is not in clinical heart failure.  BMP has been ordered stat. -Start carvedilol 6.25 mg twice daily as well.  We will wean him off nitro drip after heart catheterization.  FEN -npo for LHC -code: full -pre cath IVF -dvt ppx: heparin drip   Severity of Illness: The appropriate patient status for this patient is INPATIENT. Inpatient status is judged to be reasonable and necessary in order to provide the required intensity of service to ensure the patient's safety. The patient's presenting symptoms, physical exam findings, and initial radiographic and laboratory data in the context of their chronic comorbidities is felt to place them at high risk for further clinical deterioration. Furthermore, it is not anticipated that the patient will be medically stable for discharge from the hospital within 2 midnights of admission.   * I certify that at the point of admission it is my clinical judgment that the patient will require inpatient hospital care spanning beyond 2 midnights from the point of admission due to high intensity of service, high risk for further deterioration and high frequency of surveillance required.*    For questions or updates, please contact [facility] Please consult [email or web address] for contact info under    Signed, Gerri Spore T. Flora Lipps, MD, [facility]  [facility]  05/19/2023 1:27 PM

## 2023-05-20 ENCOUNTER — Observation Stay (HOSPITAL_BASED_OUTPATIENT_CLINIC_OR_DEPARTMENT_OTHER): Payer: Medicare Other

## 2023-05-20 ENCOUNTER — Encounter (HOSPITAL_COMMUNITY): Payer: Self-pay | Admitting: Cardiovascular Disease

## 2023-05-20 ENCOUNTER — Other Ambulatory Visit (HOSPITAL_COMMUNITY): Payer: Self-pay

## 2023-05-20 ENCOUNTER — Telehealth: Payer: Self-pay | Admitting: Medical

## 2023-05-20 ENCOUNTER — Telehealth: Payer: Self-pay | Admitting: Cardiology

## 2023-05-20 DIAGNOSIS — Z8249 Family history of ischemic heart disease and other diseases of the circulatory system: Secondary | ICD-10-CM | POA: Diagnosis not present

## 2023-05-20 DIAGNOSIS — R079 Chest pain, unspecified: Secondary | ICD-10-CM | POA: Diagnosis not present

## 2023-05-20 DIAGNOSIS — I161 Hypertensive emergency: Secondary | ICD-10-CM | POA: Diagnosis present

## 2023-05-20 DIAGNOSIS — I2510 Atherosclerotic heart disease of native coronary artery without angina pectoris: Secondary | ICD-10-CM | POA: Diagnosis present

## 2023-05-20 DIAGNOSIS — Z79899 Other long term (current) drug therapy: Secondary | ICD-10-CM | POA: Diagnosis not present

## 2023-05-20 DIAGNOSIS — I10 Essential (primary) hypertension: Secondary | ICD-10-CM | POA: Diagnosis present

## 2023-05-20 DIAGNOSIS — I214 Non-ST elevation (NSTEMI) myocardial infarction: Secondary | ICD-10-CM | POA: Diagnosis present

## 2023-05-20 DIAGNOSIS — I200 Unstable angina: Secondary | ICD-10-CM | POA: Diagnosis present

## 2023-05-20 LAB — ECHOCARDIOGRAM COMPLETE
Area-P 1/2: 3.56 cm2
Height: 69 in
S' Lateral: 3.5 cm
Weight: 2987.67 [oz_av]

## 2023-05-20 LAB — CBC
HCT: 34.6 % — ABNORMAL LOW (ref 39.0–52.0)
Hemoglobin: 11.5 g/dL — ABNORMAL LOW (ref 13.0–17.0)
MCH: 30.1 pg (ref 26.0–34.0)
MCHC: 33.2 g/dL (ref 30.0–36.0)
MCV: 90.6 fL (ref 80.0–100.0)
Platelets: 131 10*3/uL — ABNORMAL LOW (ref 150–400)
RBC: 3.82 MIL/uL — ABNORMAL LOW (ref 4.22–5.81)
RDW: 12.4 % (ref 11.5–15.5)
WBC: 7.8 10*3/uL (ref 4.0–10.5)
nRBC: 0 % (ref 0.0–0.2)

## 2023-05-20 LAB — BASIC METABOLIC PANEL
Anion gap: 9 (ref 5–15)
BUN: 7 mg/dL — ABNORMAL LOW (ref 8–23)
CO2: 25 mmol/L (ref 22–32)
Calcium: 8.6 mg/dL — ABNORMAL LOW (ref 8.9–10.3)
Chloride: 102 mmol/L (ref 98–111)
Creatinine, Ser: 1.07 mg/dL (ref 0.61–1.24)
GFR, Estimated: >60 mL/min (ref >60)
Glucose, Bld: 105 mg/dL — ABNORMAL HIGH (ref 70–99)
Potassium: 3.2 mmol/L — ABNORMAL LOW (ref 3.5–5.1)
Sodium: 136 mmol/L (ref 135–145)

## 2023-05-20 LAB — LIPID PANEL
Cholesterol: 148 mg/dL (ref 0–200)
HDL: 31 mg/dL — ABNORMAL LOW (ref >40)
LDL Cholesterol: 102 mg/dL — ABNORMAL HIGH (ref 0–99)
Total CHOL/HDL Ratio: 4.8 {ratio}
Triglycerides: 74 mg/dL (ref <150)
VLDL: 15 mg/dL (ref 0–40)

## 2023-05-20 LAB — TSH: TSH: 1.848 u[IU]/mL (ref 0.350–4.500)

## 2023-05-20 MED ORDER — TICAGRELOR 90 MG PO TABS
90.0000 mg | ORAL_TABLET | Freq: Two times a day (BID) | ORAL | 3 refills | Status: DC
  Filled 2023-05-20: qty 60, 30d supply, fill #0

## 2023-05-20 MED ORDER — ASPIRIN 81 MG PO TBEC
81.0000 mg | DELAYED_RELEASE_TABLET | Freq: Every day | ORAL | 2 refills | Status: DC
  Filled 2023-05-20: qty 30, 30d supply, fill #0

## 2023-05-20 MED ORDER — ROSUVASTATIN CALCIUM 40 MG PO TABS
40.0000 mg | ORAL_TABLET | Freq: Every day | ORAL | 3 refills | Status: DC
  Filled 2023-05-20: qty 30, 30d supply, fill #0

## 2023-05-20 MED ORDER — NITROGLYCERIN 0.4 MG SL SUBL
0.4000 mg | SUBLINGUAL_TABLET | SUBLINGUAL | 1 refills | Status: DC | PRN
  Filled 2023-05-20: qty 25, 7d supply, fill #0

## 2023-05-20 MED ORDER — LOSARTAN POTASSIUM 25 MG PO TABS
25.0000 mg | ORAL_TABLET | Freq: Every day | ORAL | Status: DC
  Administered 2023-05-20: 25 mg via ORAL
  Filled 2023-05-20: qty 1

## 2023-05-20 MED ORDER — LOSARTAN POTASSIUM 25 MG PO TABS
25.0000 mg | ORAL_TABLET | Freq: Every day | ORAL | 3 refills | Status: DC
  Filled 2023-05-20: qty 30, 30d supply, fill #0

## 2023-05-20 MED ORDER — POTASSIUM CHLORIDE CRYS ER 20 MEQ PO TBCR
40.0000 meq | EXTENDED_RELEASE_TABLET | Freq: Once | ORAL | Status: AC
  Administered 2023-05-20: 40 meq via ORAL
  Filled 2023-05-20: qty 2

## 2023-05-20 MED ORDER — ENOXAPARIN SODIUM 40 MG/0.4ML IJ SOSY
40.0000 mg | PREFILLED_SYRINGE | INTRAMUSCULAR | Status: DC
Start: 2023-05-20 — End: 2023-05-20
  Administered 2023-05-20: 40 mg via SUBCUTANEOUS
  Filled 2023-05-20: qty 0.4

## 2023-05-20 MED ORDER — CARVEDILOL 6.25 MG PO TABS
6.2500 mg | ORAL_TABLET | Freq: Two times a day (BID) | ORAL | 3 refills | Status: DC
  Filled 2023-05-20 (×2): qty 60, 30d supply, fill #0

## 2023-05-20 MED ORDER — CARVEDILOL 6.25 MG PO TABS
6.2500 mg | ORAL_TABLET | Freq: Two times a day (BID) | ORAL | 3 refills | Status: DC
  Filled 2023-05-20: qty 30, 15d supply, fill #0

## 2023-05-20 NOTE — Progress Notes (Signed)
CARDIAC REHAB PHASE I   PRE:  Rate/Rhythm: 74 SR  BP:  Sitting: 133/83      SaO2: 99 RA  MODE:  Ambulation: 340 ft   POST:  Rate/Rhythm: 76 SR  BP:  Sitting: 140/72      SaO2: 99 RA  Pt ambulated in hallway tolerated well with no CP,SOB or dizziness. Returned to bed with call bell and bedside table in reach.  Post MI/stent education including restrictions, risk factors, exercise guidelines, antiplatelet therapy importance, MI booklet, NTG use, heart healthy diabetic  diet  and CRP2 reviewed. All questions and concerns addressed. Will refer to [facility] for CRP2.   [id number] Woodroe Chen, RN BSN 05/20/2023 11:36 AM

## 2023-05-20 NOTE — Progress Notes (Signed)
Echocardiogram 2D Echocardiogram has been performed.  Warren Lacy Natia Fahmy RDCS 05/20/2023, 10:54 AM

## 2023-05-20 NOTE — Progress Notes (Signed)
Call from Sonya/[employer] who states pt had run PVCs at ~2240.

## 2023-05-20 NOTE — Discharge Summary (Signed)
Discharge Summary    Patient ID: Miguel Guzman MRN: [id number]; DOB: [birth year]  Admit date: 05/19/2023 Discharge date: 05/20/2023  PCP:  [facility] Providers Cardiologist:  None        Discharge Diagnoses    Principal Problem:   Non-ST elevation (NSTEMI) myocardial infarction [facility] [place]) Active Problems:   Hypertensive emergency    Diagnostic Studies/Procedures    Cardiac Studies & Procedures   CARDIAC CATHETERIZATION  CARDIAC CATHETERIZATION 05/19/2023  Narrative 1.  Patent left main with no obstructive disease 2.  Patent LAD with nonobstructive plaquing, most significant stenosis is a 60% lesion in the distal vessel and the diagonal branches are diffusely diseased but appropriate for medical therapy 3.  Diffusely diseased circumflex with 50% eccentric proximal stenosis and diffuse 75% distal stenosis 4.  Moderate caliber intermediate branch with 50% stenosis 5.  Severely diseased RCA with complex plaquing in the mid to distal portion involving the early bifurcation of the PDA and posterolateral branches.  Critical stenosis of the PLA branch treated successfully with a 2.75 x 16 mm Synergy DES and severe stenosis of the mid RCA before the bifurcation point treated successfully reducing a 90% stenosis to 0% with a 3.5 x 12 mm Synergy DES  Recommendations: 2D echo for assessment of LVEF, aggressive medical therapy for further risk reduction.  Could do PCI of the circumflex if recurrent ischemia, but favor medical therapy due to diffuse nature of disease.  Findings Coronary Findings Diagnostic  Dominance: Right  Left Main The left main is patent with no obstructive disease.  The vessel divides into the LAD, circumflex, and intermediate branch.  Left Anterior Descending There is mild diffuse disease throughout the vessel. The LAD has mild diffuse plaquing.  The distal vessel has a 60% stenosis.  The diagonal branches are diffusely diseased.   The first diagonal is small in caliber with 75% stenosis.  The second diagonal is moderate in caliber with 60% ostial stenosis. Dist LAD lesion is 60% stenosed.  First Diagonal Branch 1st Diag lesion is 70% stenosed. Small caliber vessel  Second Diagonal Branch 2nd Diag lesion is 60% stenosed.  Ramus Intermedius Ramus lesion is 50% stenosed.  Left Circumflex Prox Cx lesion is 50% stenosed. Mid Cx to Dist Cx lesion is 75% stenosed.  First Obtuse Marginal Branch The circumflex is diffusely diseased.  The proximal vessel has an eccentric 50% stenosis in the distal vessel has diffuse 75% stenosis.  This extends into a moderate caliber obtuse marginal branch.  Right Coronary Artery The RCA is a large, dominant vessel.  The PDA and posterolateral system bifurcate early.  The mid RCA before at the bifurcation point has an 80 to 90% stenosis.  The culprit lesion is in the posterolateral branch where there is a 95 to 99% stenosis with slow flow distal to the stenosis.  The PDA origin at the bifurcation has 70% stenosis. Ost RCA to Prox RCA lesion is 50% stenosed. Dist RCA lesion is 90% stenosed.  Right Posterior Descending Artery RPDA lesion is 70% stenosed.  Right Posterior Atrioventricular Artery RPAV lesion is 99% stenosed. The lesion is eccentric.  Intervention  Dist RCA lesion Stent A drug-eluting stent was successfully placed using a SYNERGY XD 3.50X12. Post-stent angioplasty was performed using a BALLN Lena EMERGE MR 3.75X8. Maximum pressure:  16 atm. Post-Intervention Lesion Assessment The intervention was successful. Pre-interventional TIMI flow is 3. Post-intervention TIMI flow is 3. No complications occurred at this lesion. There is a  0% residual stenosis post intervention.  RPAV lesion Stent CATH VISTA GUIDE 6FR JR4 guide catheter was inserted. Lesion crossed with guidewire using a WIRE RUNTHROUGH .[id number]. A drug-eluting stent was successfully placed using a SYNERGY XD  2.75X16. Post-Intervention Lesion Assessment The intervention was successful. Pre-interventional TIMI flow is 2. Post-intervention TIMI flow is 3. No complications occurred at this lesion. There is a 0% residual stenosis post intervention.     ECHOCARDIOGRAM  ECHOCARDIOGRAM COMPLETE 05/20/2023  Narrative ECHOCARDIOGRAM REPORT    Patient Name:   Miguel Guzman Date of Exam: 05/20/2023 Medical Rec #:  [id number]       Height:       69.0 in Accession #:    [id number]      Weight:       186.7 lb Date of Birth:  10/31/1950      BSA:          2.007 m Patient Age:    72 years        BP:           129/59 mmHg Patient Gender: M               HR:           66 bpm. Exam Location:  Inpatient  Procedure: 2D Echo, Color Doppler and Cardiac Doppler  Indications:    R07.9* Chest pain, unspecified  History:        Patient has prior history of Echocardiogram examinations, most recent 08/20/2021. Risk Factors:Hypertension.  Sonographer:    Irving Burton Senior RDCS Referring Phys: [phone] MICHAEL COOPER  IMPRESSIONS   1. Left ventricular ejection fraction, by estimation, is 55 to 60%. The left ventricle has normal function. The left ventricle has no regional wall motion abnormalities. There is mild asymmetric left ventricular hypertrophy of the infero-lateral segment. Left ventricular diastolic parameters are consistent with Grade III diastolic dysfunction (restrictive). 2. Right ventricular systolic function is normal. The right ventricular size is normal. Tricuspid regurgitation signal is inadequate for assessing PA pressure. 3. The mitral valve is normal in structure. Mild to moderate mitral valve regurgitation. 4. The aortic valve is tricuspid. Aortic valve regurgitation is not visualized. 5. The inferior vena cava is normal in size with greater than 50% respiratory variability, suggesting right atrial pressure of 3 mmHg.  Comparison(s): A prior study was performed on 08/20/2021. Grade 1 diastolic  dysfunction is now Grade 3, trivial MR is now mild/moderate, otherwise no significant change.  FINDINGS Left Ventricle: Left ventricular ejection fraction, by estimation, is 55 to 60%. The left ventricle has normal function. The left ventricle has no regional wall motion abnormalities. The left ventricular internal cavity size was normal in size. There is mild asymmetric left ventricular hypertrophy of the infero-lateral segment. Left ventricular diastolic parameters are consistent with Grade III diastolic dysfunction (restrictive).  Right Ventricle: The right ventricular size is normal. No increase in right ventricular wall thickness. Right ventricular systolic function is normal. Tricuspid regurgitation signal is inadequate for assessing PA pressure.  Left Atrium: Left atrial size was normal in size.  Right Atrium: Right atrial size was normal in size.  Pericardium: There is no evidence of pericardial effusion. Presence of epicardial fat layer.  Mitral Valve: The mitral valve is normal in structure. Mild to moderate mitral valve regurgitation, with posteriorly-directed jet.  Tricuspid Valve: The tricuspid valve is normal in structure. Tricuspid valve regurgitation is mild.  Aortic Valve: The aortic valve is tricuspid. Aortic valve regurgitation is not visualized.  Pulmonic Valve:  The pulmonic valve was normal in structure. Pulmonic valve regurgitation is trivial.  Aorta: The aortic root and ascending aorta are structurally normal, with no evidence of dilitation.  Venous: The inferior vena cava is normal in size with greater than 50% respiratory variability, suggesting right atrial pressure of 3 mmHg.  IAS/Shunts: No atrial level shunt detected by color flow Doppler.   LEFT VENTRICLE PLAX 2D LVIDd:         4.60 cm   Diastology LVIDs:         3.50 cm   LV e' medial:    6.22 cm/s LV PW:         1.20 cm   LV E/e' medial:  15.7 LV IVS:        0.90 cm   LV e' lateral:   7.46 cm/s LVOT  diam:     1.90 cm   LV E/e' lateral: 13.1 LV SV:         42 LV SV Index:   21 LVOT Area:     2.84 cm   RIGHT VENTRICLE RV S prime:     10.60 cm/s TAPSE (M-mode): 1.8 cm  LEFT ATRIUM             Index        RIGHT ATRIUM           Index LA diam:        4.30 cm 2.14 cm/m   RA Area:     15.20 cm LA Vol (A2C):   60.7 ml 30.25 ml/m  RA Volume:   33.70 ml  16.79 ml/m LA Vol (A4C):   65.5 ml 32.64 ml/m LA Biplane Vol: 66.4 ml 33.09 ml/m AORTIC VALVE LVOT Vmax:   76.20 cm/s LVOT Vmean:  52.900 cm/s LVOT VTI:    0.149 m  AORTA Ao Root diam: 3.10 cm Ao Asc diam:  3.40 cm  MITRAL VALVE MV Area (PHT): 3.56 cm    SHUNTS MV Decel Time: 213 msec    Systemic VTI:  0.15 m MV E velocity: 97.50 cm/s  Systemic Diam: 1.90 cm MV A velocity: 37.00 cm/s MV E/A ratio:  2.64  Sunit Tolia Electronically signed by Tessa Lerner Signature Date/Time: 05/20/2023/12:01:49 PM    Final             _____________   History of Present Illness     Miguel Guzman is a 72 y.o. male with history of hypertension. He presented to the ED on 10/24 with acute chest pain that began while climbing a flight of stairs. Described as "burning pressure" and was associated with shortness of breath. Symptoms did not improve with rest which prompted call to EMS. He has not seen a primary care doctor in quite some time. He reports that he has been healthy. [facility] able to review records. He was seen by a [facility] primary care physician in [place] in 2019. He was prescribed losartan for hypertension. He does not appear to have been taking this.   Hospital Course     Consultants: n/a   Chest pain NSTEMI Multi-Vessel CAD  On arrival to [facility], blood pressure severely elevated at 215/97. Patient attributed this to 10 out of 10 pressure in his chest. He received hydralazine and nitroglycerin with improvement in symptoms. EKG with sinus rhythm and ~LVH. Deep inferolateral T wave inversion noted.  Patient ultimately started on heparin/nitroglycerin. Troponin 16->68->461. LHC showed severely diseased RCA with complex plaquing in mid to distal portion  involving the early bifurcation of the PDA and posterolateral branches. Critical stenosis of the PLA branch treated successfully with a 2.75 x 16 mm Synergy DES and severe stenosis of the mid RCA before the bifurcation point treated successfully reducing a 90% stenosis to 0% with a 3.5 x 12 mm Synergy DES. Diffusely disease LCX with 50% eccentric proximal stenosis and deffuse 75% distal stenosis was not intervened on, medical management recommended due to diffuse disease. On day of discharge, patient feeling well, no recurrence of symptoms.   Medical management of residual disease in distal RCA, distal LCX, PDA. DAPT with ASA/Brilinta x12 months minimally.  Crestor 40mg  Coreg 6.25mg  BID  Hypertension  Patient with hx of hypertension has previously been prescribed Losartan but does not appear to have taken recently. BP stable with initiation of anti-hypertensive agents this admission. On discharge:  Losartan 25mg  Coreg 6.25mg  BID      Did the patient have an acute coronary syndrome (MI, NSTEMI, STEMI, etc) this admission?:  Yes                               AHA/ACC ACS Clinical Performance & Quality Measures: Aspirin prescribed? - Yes ADP Receptor Inhibitor (Plavix/Clopidogrel, Brilinta/Ticagrelor or Effient/Prasugrel) prescribed (includes medically managed patients)? - Yes Beta Blocker prescribed? - Yes High Intensity Statin (Lipitor 40-80mg  or Crestor 20-40mg ) prescribed? - Yes EF assessed during THIS hospitalization? - Yes For EF <40%, was ACEI/ARB prescribed? - Not Applicable (EF >/= 40%) For EF <40%, Aldosterone Antagonist (Spironolactone or Eplerenone) prescribed? - Not Applicable (EF >/= 40%) Cardiac Rehab Phase II ordered (including medically managed patients)? - Yes       The patient will be scheduled for a TOC follow up  appointment in the next 14 days.  A message has been sent to the [facility] and Scheduling Pool at the office where the patient should be seen for follow up.  _____________  Discharge Vitals Blood pressure 126/68, pulse 60, temperature 98.3 F (36.8 C), temperature source Oral, resp. rate 20, height 5\' 9"  (1.753 m), weight 84.7 kg, SpO2 97%.  Filed Weights   05/19/23 2030  Weight: 84.7 kg    Labs & Radiologic Studies    CBC Recent Labs    05/19/23 0936 05/20/23 0221  WBC 7.9 7.8  HGB 12.1* 11.5*  HCT 35.7* 34.6*  MCV 91.5 90.6  PLT 126* 131*   Basic Metabolic Panel Recent Labs    16/10/96 1319 05/20/23 0221  NA 137 136  K 3.2* 3.2*  CL 101 102  CO2 26 25  GLUCOSE 167* 105*  BUN 7* 7*  CREATININE 1.11 1.07  CALCIUM 9.0 8.6*   Liver Function Tests Recent Labs    05/19/23 1139  AST 24  ALT 13  ALKPHOS 57  BILITOT 0.7  PROT 7.5  ALBUMIN 3.4*   No results for input(s): "LIPASE", "AMYLASE" in the last 72 hours. High Sensitivity Troponin:   Recent Labs  Lab 05/19/23 0936 05/19/23 1139 05/19/23 1450  TROPONINIHS 16 68* 461*    BNP Invalid input(s): "POCBNP" D-Dimer Recent Labs    05/19/23 1139  DDIMER 0.88*   Hemoglobin A1C No results for input(s): "HGBA1C" in the last 72 hours. Fasting Lipid Panel Recent Labs    05/20/23 0221  CHOL 148  HDL 31*  LDLCALC 102*  TRIG 74  CHOLHDL 4.8   Thyroid Function Tests Recent Labs    05/20/23 0221  TSH 1.848  _____________  ECHOCARDIOGRAM COMPLETE  Result Date: 05/20/2023    ECHOCARDIOGRAM REPORT   Patient Name:   Miguel Guzman Date of Exam: 05/20/2023 Medical Rec #:  [id number]       Height:       69.0 in Accession #:    [id number]      Weight:       186.7 lb Date of Birth:  Jul 11, 1951      BSA:          2.007 m Patient Age:    72 years        BP:           129/59 mmHg Patient Gender: M               HR:           66 bpm. Exam Location:  Inpatient Procedure: 2D Echo, Color Doppler and Cardiac  Doppler Indications:    R07.9* Chest pain, unspecified  History:        Patient has prior history of Echocardiogram examinations, most                 recent 08/20/2021. Risk Factors:Hypertension.  Sonographer:    Irving Burton Senior RDCS Referring Phys: [phone] MICHAEL COOPER IMPRESSIONS  1. Left ventricular ejection fraction, by estimation, is 55 to 60%. The left ventricle has normal function. The left ventricle has no regional wall motion abnormalities. There is mild asymmetric left ventricular hypertrophy of the infero-lateral segment. Left ventricular diastolic parameters are consistent with Grade III diastolic dysfunction (restrictive).  2. Right ventricular systolic function is normal. The right ventricular size is normal. Tricuspid regurgitation signal is inadequate for assessing PA pressure.  3. The mitral valve is normal in structure. Mild to moderate mitral valve regurgitation.  4. The aortic valve is tricuspid. Aortic valve regurgitation is not visualized.  5. The inferior vena cava is normal in size with greater than 50% respiratory variability, suggesting right atrial pressure of 3 mmHg. Comparison(s): A prior study was performed on 08/20/2021. Grade 1 diastolic dysfunction is now Grade 3, trivial MR is now mild/moderate, otherwise no significant change. FINDINGS  Left Ventricle: Left ventricular ejection fraction, by estimation, is 55 to 60%. The left ventricle has normal function. The left ventricle has no regional wall motion abnormalities. The left ventricular internal cavity size was normal in size. There is  mild asymmetric left ventricular hypertrophy of the infero-lateral segment. Left ventricular diastolic parameters are consistent with Grade III diastolic dysfunction (restrictive). Right Ventricle: The right ventricular size is normal. No increase in right ventricular wall thickness. Right ventricular systolic function is normal. Tricuspid regurgitation signal is inadequate for assessing PA pressure.  Left Atrium: Left atrial size was normal in size. Right Atrium: Right atrial size was normal in size. Pericardium: There is no evidence of pericardial effusion. Presence of epicardial fat layer. Mitral Valve: The mitral valve is normal in structure. Mild to moderate mitral valve regurgitation, with posteriorly-directed jet. Tricuspid Valve: The tricuspid valve is normal in structure. Tricuspid valve regurgitation is mild. Aortic Valve: The aortic valve is tricuspid. Aortic valve regurgitation is not visualized. Pulmonic Valve: The pulmonic valve was normal in structure. Pulmonic valve regurgitation is trivial. Aorta: The aortic root and ascending aorta are structurally normal, with no evidence of dilitation. Venous: The inferior vena cava is normal in size with greater than 50% respiratory variability, suggesting right atrial pressure of 3 mmHg. IAS/Shunts: No atrial level shunt detected by color flow Doppler.  LEFT VENTRICLE  PLAX 2D LVIDd:         4.60 cm   Diastology LVIDs:         3.50 cm   LV e' medial:    6.22 cm/s LV PW:         1.20 cm   LV E/e' medial:  15.7 LV IVS:        0.90 cm   LV e' lateral:   7.46 cm/s LVOT diam:     1.90 cm   LV E/e' lateral: 13.1 LV SV:         42 LV SV Index:   21 LVOT Area:     2.84 cm  RIGHT VENTRICLE RV S prime:     10.60 cm/s TAPSE (M-mode): 1.8 cm LEFT ATRIUM             Index        RIGHT ATRIUM           Index LA diam:        4.30 cm 2.14 cm/m   RA Area:     15.20 cm LA Vol (A2C):   60.7 ml 30.25 ml/m  RA Volume:   33.70 ml  16.79 ml/m LA Vol (A4C):   65.5 ml 32.64 ml/m LA Biplane Vol: 66.4 ml 33.09 ml/m  AORTIC VALVE LVOT Vmax:   76.20 cm/s LVOT Vmean:  52.900 cm/s LVOT VTI:    0.149 m  AORTA Ao Root diam: 3.10 cm Ao Asc diam:  3.40 cm MITRAL VALVE MV Area (PHT): 3.56 cm    SHUNTS MV Decel Time: 213 msec    Systemic VTI:  0.15 m MV E velocity: 97.50 cm/s  Systemic Diam: 1.90 cm MV A velocity: 37.00 cm/s MV E/A ratio:  2.64 Sunit Tolia Electronically signed by Tessa Lerner Signature Date/Time: 05/20/2023/12:01:49 PM    Final    CARDIAC CATHETERIZATION  Result Date: 05/19/2023 1.  Patent left main with no obstructive disease 2.  Patent LAD with nonobstructive plaquing, most significant stenosis is a 60% lesion in the distal vessel and the diagonal branches are diffusely diseased but appropriate for medical therapy 3.  Diffusely diseased circumflex with 50% eccentric proximal stenosis and diffuse 75% distal stenosis 4.  Moderate caliber intermediate branch with 50% stenosis 5.  Severely diseased RCA with complex plaquing in the mid to distal portion involving the early bifurcation of the PDA and posterolateral branches.  Critical stenosis of the PLA branch treated successfully with a 2.75 x 16 mm Synergy DES and severe stenosis of the mid RCA before the bifurcation point treated successfully reducing a 90% stenosis to 0% with a 3.5 x 12 mm Synergy DES Recommendations: 2D echo for assessment of LVEF, aggressive medical therapy for further risk reduction.  Could do PCI of the circumflex if recurrent ischemia, but favor medical therapy due to diffuse nature of disease.   DG Chest Portable 1 View  Result Date: 05/19/2023 CLINICAL DATA:  Shortness of breath. EXAM: PORTABLE CHEST 1 VIEW COMPARISON:  None Available. FINDINGS: Bilateral lung fields are clear. Note is made of elevated right hemidiaphragm. Bilateral lateral costophrenic angles are clear. Normal cardio-mediastinal silhouette. No acute osseous abnormalities. The soft tissues are within normal limits. IMPRESSION: No acute cardiopulmonary abnormality. Electronically Signed   By: Jules Schick M.D.   On: 05/19/2023 10:58   Disposition   Pt is being discharged home today in good condition.  Follow-up Plans & Appointments     Follow-up Information     Fransico Michael,  Cadence H, PA-C Follow up.   Specialty: Cardiology Why: 05/24/23 @8 :25am Contact information: [address] [place]  [address] [phone]                Discharge Instructions     Amb Referral to Cardiac Rehabilitation   Complete by: As directed    Diagnosis:  Coronary Stents NSTEMI     After initial evaluation and assessments completed: Virtual Based Care may be provided alone or in conjunction with Phase 2 Cardiac Rehab based on patient barriers.: Yes   Intensive Cardiac Rehabilitation (ICR) [facility] location only OR Traditional Cardiac Rehabilitation (TCR) *If criteria for ICR are not met will enroll in TCR [facility] only): Yes        Discharge Medications   Allergies as of 05/20/2023       Reactions   Penicillins Hives        Medication List     TAKE these medications    aspirin EC 81 MG tablet Take 1 tablet (81 mg total) by mouth daily. Swallow whole. Start taking on: May 21, 2023   carvedilol 6.25 MG tablet Commonly known as: COREG Take 1 tablet (6.25 mg total) by mouth 2 (two) times daily with a meal.   losartan 25 MG tablet Commonly known as: COZAAR Take 1 tablet (25 mg total) by mouth daily. Start taking on: May 21, 2023   nitroGLYCERIN 0.4 MG SL tablet Commonly known as: NITROSTAT Place 1 tablet (0.4 mg total) under the tongue every 5 (five) minutes x 3 doses as needed for chest pain.   rosuvastatin 40 MG tablet Commonly known as: CRESTOR Take 1 tablet (40 mg total) by mouth daily. Start taking on: May 21, 2023   ticagrelor 90 MG Tabs tablet Commonly known as: BRILINTA Take 1 tablet (90 mg total) by mouth 2 (two) times daily.           Outstanding Labs/Studies    Duration of Discharge Encounter   Greater than 30 minutes including physician time.  Con Memos, PA-C 05/20/2023, 12:54 PM

## 2023-05-20 NOTE — Progress Notes (Signed)
Cardiology Progress Note  Patient ID: Miguel Guzman MRN: [id number] DOB: Dec 08, 1950 Date of Encounter: 05/20/2023 Primary Cardiologist: None  Subjective   Chief Complaint: None.   HPI: No chest pain.  Doing well after cath.  Echo pending.  ROS:  All other ROS reviewed and negative. Pertinent positives noted in the HPI.     Vital Signs   Vitals:   05/20/23 0706 05/20/23 0711 05/20/23 0716 05/20/23 0723  BP:    133/83  Pulse: (!) 0 (!) 0 (!) 0 74  Resp:    17  Temp:    98.5 F (36.9 C)  TempSrc:    Oral  SpO2:    98%  Weight:      Height:        Intake/Output Summary (Last 24 hours) at 05/20/2023 0857 Last data filed at 05/20/2023 0800 Gross per 24 hour  Intake 1650.08 ml  Output --  Net 1650.08 ml      05/19/2023    8:30 PM  Last 3 Weights  Weight (lbs) 186 lb 11.7 oz  Weight (kg) 84.7 kg      Telemetry  Overnight telemetry shows SR 60s, which I personally reviewed.   ECG  The most recent ECG shows SR 66, LVH by voltage, which I personally reviewed.   Physical Exam   Vitals:   05/20/23 0706 05/20/23 0711 05/20/23 0716 05/20/23 0723  BP:    133/83  Pulse: (!) 0 (!) 0 (!) 0 74  Resp:    17  Temp:    98.5 F (36.9 C)  TempSrc:    Oral  SpO2:    98%  Weight:      Height:        Intake/Output Summary (Last 24 hours) at 05/20/2023 0857 Last data filed at 05/20/2023 0800 Gross per 24 hour  Intake 1650.08 ml  Output --  Net 1650.08 ml       05/19/2023    8:30 PM  Last 3 Weights  Weight (lbs) 186 lb 11.7 oz  Weight (kg) 84.7 kg    Body mass index is 27.58 kg/m.  General: Well nourished, well developed, in no acute distress Head: Atraumatic, normal size  Eyes: PEERLA, EOMI  Neck: Supple, no JVD Endocrine: No thryomegaly Cardiac: Normal S1, S2; RRR; no murmurs, rubs, or gallops Lungs: Clear to auscultation bilaterally, no wheezing, rhonchi or rales  Abd: Soft, nontender, no hepatomegaly  Ext: No edema, pulses 2+ Musculoskeletal: No  deformities, BUE and BLE strength normal and equal Skin: Warm and dry, no rashes   Neuro: Alert and oriented to person, place, time, and situation, CNII-XII grossly intact, no focal deficits  Psych: Normal mood and affect   Cardiac Studies  [facility] 05/19/2023 Conclusion  1.  Patent left main with no obstructive disease 2.  Patent LAD with nonobstructive plaquing, most significant stenosis is a 60% lesion in the distal vessel and the diagonal branches are diffusely diseased but appropriate for medical therapy 3.  Diffusely diseased circumflex with 50% eccentric proximal stenosis and diffuse 75% distal stenosis 4.  Moderate caliber intermediate branch with 50% stenosis 5.  Severely diseased RCA with complex plaquing in the mid to distal portion involving the early bifurcation of the PDA and posterolateral branches.  Critical stenosis of the PLA branch treated successfully with a 2.75 x 16 mm Synergy DES and severe stenosis of the mid RCA before the bifurcation point treated successfully reducing a 90% stenosis to 0% with a 3.5 x 12 mm Synergy  DES  Patient Profile  Miguel Guzman is a 72 y.o. male with hypertension was admitted on 05/19/2023 with non-STEMI.  Assessment & Plan   # Non-STEMI -Critical lesions in the RCA status post PCI. -Continue aspirin and Brilinta for 1 year.  Crestor 40 mg daily. -He does have residual disease 60% distal RCA, 75% distal circumflex and 70% PDA.  This will be managed medically.  No chest pain this morning. -BNP was elevated.  But no signs of heart failure.  Continue carvedilol 6.25 mg twice daily.  I have added losartan 25 mg daily. -Echo is pending.  Anticipate for discharge later this afternoon unless we need to titrate medical therapy.  # Hypertension -Carvedilol and losartan.  FEN -no IVF -code: full -dvt ppx: lovenox -diet: heart healthy -disposition: pending echo    For questions or updates, please contact [facility] Please consult  [email or web address] for contact info under        Signed, Gerri Spore T. Flora Lipps, MD, [facility]  [facility]  05/20/2023 8:57 AM

## 2023-05-20 NOTE — TOC Benefit Eligibility Note (Signed)
Pharmacy Patient Advocate Encounter  Insurance verification completed.    The patient is insured through  [employer] .     Ran test claim for BRILINTA and the current 30 day co-pay is 0.00.   This test claim was processed through [facility]- copay amounts may vary at other pharmacies due to pharmacy/plan contracts, or as the patient moves through the different stages of their insurance plan.

## 2023-05-20 NOTE — TOC Initial Note (Signed)
Transition of Care [facility]) - Initial/Assessment Note    Patient Details  Name: Miguel Guzman MRN: [id number] Date of Birth: Jul 17, 1951  [facility]) CM/SW Contact:    Leone Haven, RN Phone Number: 05/20/2023, 1:17 PM  Clinical Narrative:                 From home with spouse, has PCP and insurance on file, states has no HH services in place at this time or DME at home.  States family member will transport them home at [employer] and family is support system, states gets medications from [facility]  on [address]. In [place].  Sandria Bales self ambulatory  NCM contacted Cardiology office where he as his follow up. They will contact him to give samples and start patient ast application.  Patient tells NCM that he just started a new job and will have insurance when it is time for his refills.   Expected Discharge Plan: Home/Self Care Barriers to Discharge: No Barriers Identified   Patient Goals and CMS Choice Patient states their goals for this hospitalization and ongoing recovery are:: return home   Choice offered to / list presented to : NA      Expected Discharge Plan and Services In-house Referral: NA Discharge Planning Services: CM Consult Post Acute Care Choice: NA Living arrangements for the past 2 months: Single Family Home Expected Discharge Date: 05/20/23               DME Arranged: N/A DME Agency: NA       HH Arranged: NA          Prior Living Arrangements/Services Living arrangements for the past 2 months: Single Family Home Lives with:: Spouse Patient language and need for interpreter reviewed:: Yes Do you feel safe going back to the place where you live?: Yes      Need for Family Participation in Patient Care: Yes (Comment) Care giver support system in place?: Yes (comment)   Criminal Activity/Legal Involvement Pertinent to Current Situation/Hospitalization: No - Comment as needed  Activities of Daily Living   ADL Screening (condition at time of  admission) Independently performs ADLs?: Yes (appropriate for developmental age) Is the patient deaf or have difficulty hearing?: Yes Does the patient have difficulty seeing, even when wearing glasses/contacts?: No Does the patient have difficulty concentrating, remembering, or making decisions?: No  Permission Sought/Granted Permission sought to share information with : Case Manager Permission granted to share information with : Yes, Verbal Permission Granted              Emotional Assessment   Attitude/Demeanor/Rapport: Engaged Affect (typically observed): Appropriate Orientation: : Oriented to Self, Oriented to Place, Oriented to Situation, Oriented to  Time Alcohol / Substance Use: Not Applicable Psych Involvement: No (comment)  Admission diagnosis:  Unstable angina (HCC) [I20.0] NSTEMI (non-ST elevated myocardial infarction) [facility]) [I21.4] Patient Active Problem List   Diagnosis Date Noted   Unstable angina (HCC) 05/19/2023   Non-ST elevation (NSTEMI) myocardial infarction [facility]) 05/19/2023   Hypertensive emergency 05/19/2023   PCP:  [facility] Pharmacy:   [id number] - [place], [place] - [address] [place] [address] Phone: [phone] Fax: [phone]  [facility] [address] [place] [address] Phone: [phone] Fax: [phone]     Social Determinants of Health (SDOH) Social History: SDOH Screenings   Food Insecurity: No Food Insecurity (05/19/2023)  Housing: Low Risk  (05/19/2023)  Transportation Needs:  No Transportation Needs (05/19/2023)  Utilities: Not At Risk (05/19/2023)  Tobacco Use: Low Risk  (05/19/2023)   SDOH Interventions:     Readmission Risk Interventions     No data to display

## 2023-05-20 NOTE — Telephone Encounter (Addendum)
   Transition of Care Follow-up Phone Call Request    Patient Name: Miguel Guzman Date of Birth: March 11, 1951 Date of Encounter: 05/20/2023  Primary Care Provider:  [facility] Primary Cardiologist:  None  Miguel Guzman has been scheduled for a transition of care follow up appointment with a [facility] provider:  Cadence Furth on 05/24/23 @8 :25am.   Please reach out to Miguel Guzman within 48 hours of discharge to confirm appointment and review transition of care protocol questionnaire. Anticipated discharge date: 05/20/23  Perlie Gold, PA-C  05/20/2023, 2:58 PM

## 2023-05-20 NOTE — Telephone Encounter (Signed)
Patient calling the office for samples of medication:   1.  What medication and dosage are you requesting samples for? ticagrelor (BRILINTA) tablet 90 mg  2.  Are you currently out of this medication?   Yes   Hospital calling for the pt the pt does not have any medication coverage and would like assistance filling out the pt assistance paperwork. Please advise

## 2023-05-20 NOTE — TOC Transition Note (Signed)
Transition of Care [facility]) - CM/SW Discharge Note   Patient Details  Name: Miguel Guzman MRN: [id number] Date of Birth: 23-Mar-1951  Transition of Care [facility]) CM/SW Contact:  Leone Haven, RN Phone Number: 05/20/2023, 1:23 PM   Clinical Narrative:    For dc, TOC to fill meds. He has transport home.   Final next level of care: Home/Self Care Barriers to Discharge: No Barriers Identified   Patient Goals and CMS Choice   Choice offered to / list presented to : NA  Discharge Placement                         Discharge Plan and Services Additional resources added to the After Visit Summary for   In-house Referral: NA Discharge Planning Services: CM Consult Post Acute Care Choice: NA          DME Arranged: N/A DME Agency: NA       HH Arranged: NA          Social Determinants of Health (SDOH) Interventions SDOH Screenings   Food Insecurity: No Food Insecurity (05/19/2023)  Housing: Low Risk  (05/19/2023)  Transportation Needs: No Transportation Needs (05/19/2023)  Utilities: Not At Risk (05/19/2023)  Tobacco Use: Low Risk  (05/19/2023)     Readmission Risk Interventions     No data to display

## 2023-05-23 ENCOUNTER — Encounter: Payer: Self-pay | Admitting: *Deleted

## 2023-05-23 LAB — LIPOPROTEIN A (LPA): Lipoprotein (a): 42 nmol/L — ABNORMAL HIGH (ref <75.0)

## 2023-05-23 NOTE — Telephone Encounter (Signed)
Will complete application and have him sign at his appointment tomorrow.

## 2023-05-23 NOTE — Telephone Encounter (Signed)
Transition Care Management Unsuccessful Follow-up Telephone Call  Date of discharge and from where:  Friday 05/20/23 at [facility]  Attempts:  1st Attempt  Reason for unsuccessful TCM follow-up call:  Left voice message

## 2023-05-23 NOTE — Telephone Encounter (Signed)
Pt is returning call.  

## 2023-05-24 ENCOUNTER — Encounter: Payer: Self-pay | Admitting: Medical

## 2023-05-24 ENCOUNTER — Ambulatory Visit: Payer: Medicare Other | Attending: Medical | Admitting: Medical

## 2023-05-24 ENCOUNTER — Encounter: Payer: Self-pay | Admitting: *Deleted

## 2023-05-24 VITALS — BP 140/70 | HR 69 | Ht 69.5 in | Wt 188.0 lb

## 2023-05-24 DIAGNOSIS — I214 Non-ST elevation (NSTEMI) myocardial infarction: Secondary | ICD-10-CM

## 2023-05-24 DIAGNOSIS — I2510 Atherosclerotic heart disease of native coronary artery without angina pectoris: Secondary | ICD-10-CM

## 2023-05-24 DIAGNOSIS — I10 Essential (primary) hypertension: Secondary | ICD-10-CM | POA: Diagnosis not present

## 2023-05-24 DIAGNOSIS — E782 Mixed hyperlipidemia: Secondary | ICD-10-CM

## 2023-05-24 DIAGNOSIS — E876 Hypokalemia: Secondary | ICD-10-CM

## 2023-05-24 MED ORDER — LOSARTAN POTASSIUM 50 MG PO TABS: 50.0000 mg | ORAL_TABLET | Freq: Every day | ORAL | 3 refills | Status: DC

## 2023-05-24 NOTE — Patient Instructions (Signed)
Medication Instructions:  Your physician has recommended you make the following change in your medication:   Increase Losartan to 50 mg once daily   *If you need a refill on your cardiac medications before your next appointment, please call your pharmacy*   Lab Work: CBC & BMET today here in the office.   If you have labs (blood work) drawn today and your tests are completely normal, you will receive your results only by: MyChart Message (if you have MyChart) OR A paper copy in the mail If you have any lab test that is abnormal or we need to change your treatment, we will call you to review the results.   Testing/Procedures: None   Follow-Up: At [facility], you and your health needs are our priority.  As part of our continuing mission to provide you with exceptional heart care, we have created designated Provider Care Teams.  These Care Teams include your primary Cardiologist (physician) and Advanced Practice Providers (APPs -  Physician Assistants and Nurse Practitioners) who all work together to provide you with the care you need, when you need it.  We recommend signing up for the patient portal called "MyChart".  Sign up information is provided on this After Visit Summary.  MyChart is used to connect with patients for Virtual Visits (Telemedicine).  Patients are able to view lab/test results, encounter notes, upcoming appointments, etc.  Non-urgent messages can be sent to your provider as well.   To learn more about what you can do with MyChart, go to [email or web address].    Your next appointment:   1 month(s)  Provider:   You may see one of the following Advanced Practice Providers on your designated Care Team:   Nicolasa Ducking, NP Eula Listen, PA-C Cadence Fransico Michael, PA-C Charlsie Quest, NP

## 2023-05-24 NOTE — Telephone Encounter (Signed)
Patient has arrived for [facility] appointment as scheduled but was unable to be reached prior

## 2023-05-24 NOTE — Progress Notes (Signed)
Cardiology Office Note:    Date:  05/24/2023   ID:  Miguel Guzman, DOB [birth year], MRN [id number]  PCP:  [facility]  [facility] Cardiologist:  None  [facility] Electrophysiologist:  None   Referring MD: [facility]   Chief Complaint: Hospital follow-up  History of Present Illness:    Miguel Guzman is a 72 y.o. male with a hx of hypertension, CAD s/p DES PLA branch and DES mRCA, HLD  who presents for hospital follow-up.  Patient was admitted in late October 2024 with chest pain.  High-sensitivity troponin was elevated to the 400s and blood pressure was 215/97.  Left heart cath showed severely diseased RCA with complex plaquing in the mid to distal portion involving early bifurcation of the PDA and posterolateral branches.  Reciprocal stenosis of the PLA branch treated successfully with DES placement and severe stenosis of the mid RCA before the bifurcation point treated successfully reducing 90% stenosis to 0% stenosis with DES placement.  There was diffuse disease in the left circumflex with 50% eccentric proximal stenosis and diffuse 75% distal stenosis that was not intervened on.  Medical management was recommended for otherwise diffuse disease.  Patient was started on DAPT with aspirin and Brilinta for 12 months.  Patient was started on Crestor 40 mg daily, losartan 25 mg daily and Coreg 6.25 mg twice daily.  Echo showed EF 55 to 60%, grade 3 diastolic dysfunction, mild to moderate MR.  Today, the patient is overall doing well. He is back at home and doing well. Cath site is stable. He had minimal soreness on the cath site. The patient denies chest pain or SOB. He went for walk yesterday that was about 5 minutes and had no anginal symptoms. He denies SOB, LLE, orthopea, pnd, lightheadedness, dizziness.   Past Medical History:  Diagnosis Date   Hypertension     Past Surgical History:  Procedure Laterality Date   CORONARY STENT INTERVENTION N/A 05/19/2023    Procedure: CORONARY STENT INTERVENTION;  Surgeon: Tonny Bollman, MD;  Location: [facility] INVASIVE CV LAB;  Service: Cardiovascular;  Laterality: N/A;   LEFT HEART CATH AND CORONARY ANGIOGRAPHY N/A 05/19/2023   Procedure: LEFT HEART CATH AND CORONARY ANGIOGRAPHY;  Surgeon: Tonny Bollman, MD;  Location: [facility] INVASIVE CV LAB;  Service: Cardiovascular;  Laterality: N/A;    Current Medications: Current Meds  Medication Sig   aspirin EC 81 MG tablet Take 1 tablet (81 mg total) by mouth daily. Swallow whole.   carvedilol (COREG) 6.25 MG tablet Take 1 tablet (6.25 mg total) by mouth 2 (two) times daily with a meal.   losartan (COZAAR) 50 MG tablet Take 1 tablet (50 mg total) by mouth daily.   nitroGLYCERIN (NITROSTAT) 0.4 MG SL tablet Place 1 tablet (0.4 mg total) under the tongue every 5 (five) minutes x 3 doses as needed for chest pain.   rosuvastatin (CRESTOR) 40 MG tablet Take 1 tablet (40 mg total) by mouth daily.   ticagrelor (BRILINTA) 90 MG TABS tablet Take 1 tablet (90 mg total) by mouth 2 (two) times daily.   [DISCONTINUED] losartan (COZAAR) 25 MG tablet Take 1 tablet (25 mg total) by mouth daily.     Allergies:   Penicillins   Social History   Socioeconomic History   Marital status: Married    Spouse name: Not on file   Number of children: Not on file   Years of education: Not on file   Highest education level: Not on file  Occupational History   Not on file  Tobacco Use   Smoking status: Never   Smokeless tobacco: Never  Substance and Sexual Activity   Alcohol use: Never   Drug use: Never   Sexual activity: Not on file  Other Topics Concern   Not on file  Social History Narrative   Not on file   Social Determinants of Health   Financial Resource Strain: Not on file  Food Insecurity: No Food Insecurity (05/19/2023)   Hunger Vital Sign    Worried About Running Out of Food in the Last Year: Never true    Ran Out of Food in the Last Year: Never true  Transportation Needs:  No Transportation Needs (05/19/2023)   PRAPARE - Administrator, Civil Service (Medical): No    Lack of Transportation (Non-Medical): No  Physical Activity: Not on file  Stress: Not on file  Social Connections: Not on file     Family History: The patient's family history includes Hypertension in his father and mother.  ROS:   Please see the history of present illness.     All other systems reviewed and are negative.  EKGs/Labs/Other Studies Reviewed:    The following studies were reviewed today:  Echo 04/2023 1. Left ventricular ejection fraction, by estimation, is 55 to 60%. The  left ventricle has normal function. The left ventricle has no regional  wall motion abnormalities. There is mild asymmetric left ventricular  hypertrophy of the infero-lateral  segment. Left ventricular diastolic parameters are consistent with Grade  III diastolic dysfunction (restrictive).   2. Right ventricular systolic function is normal. The right ventricular  size is normal. Tricuspid regurgitation signal is inadequate for assessing  PA pressure.   3. The mitral valve is normal in structure. Mild to moderate mitral valve  regurgitation.   4. The aortic valve is tricuspid. Aortic valve regurgitation is not  visualized.   5. The inferior vena cava is normal in size with greater than 50%  respiratory variability, suggesting right atrial pressure of 3 mmHg.   Comparison(s): A prior study was performed on 08/20/2021. Grade 1  diastolic dysfunction is now Grade 3, trivial MR is now mild/moderate,  otherwise no significant change.    LHC 04/2023 1.  Patent left main with no obstructive disease 2.  Patent LAD with nonobstructive plaquing, most significant stenosis is a 60% lesion in the distal vessel and the diagonal branches are diffusely diseased but appropriate for medical therapy 3.  Diffusely diseased circumflex with 50% eccentric proximal stenosis and diffuse 75% distal  stenosis 4.  Moderate caliber intermediate branch with 50% stenosis 5.  Severely diseased RCA with complex plaquing in the mid to distal portion involving the early bifurcation of the PDA and posterolateral branches.  Critical stenosis of the PLA branch treated successfully with a 2.75 x 16 mm Synergy DES and severe stenosis of the mid RCA before the bifurcation point treated successfully reducing a 90% stenosis to 0% with a 3.5 x 12 mm Synergy DES   Recommendations: 2D echo for assessment of LVEF, aggressive medical therapy for further risk reduction.  Could do PCI of the circumflex if recurrent ischemia, but favor medical therapy due to diffuse nature of disease.   Recent Labs: 05/19/2023: ALT 13; B Natriuretic Peptide 227.9 05/20/2023: BUN 7; Creatinine, Ser 1.07; Hemoglobin 11.5; Platelets 131; Potassium 3.2; Sodium 136; TSH 1.848  Recent Lipid Panel    Component Value Date/Time   CHOL 148 05/20/2023 0221   TRIG  74 05/20/2023 0221   HDL 31 (L) 05/20/2023 0221   CHOLHDL 4.8 05/20/2023 0221   VLDL 15 05/20/2023 0221   LDLCALC 102 (H) 05/20/2023 0221    Physical Exam:    VS:  BP (!) 140/70 (BP Location: Left Arm, Patient Position: Sitting, Cuff Size: Normal)   Pulse 69   Ht 5' 9.5" (1.765 m)   Wt 188 lb (85.3 kg)   SpO2 97%   BMI 27.36 kg/m     Wt Readings from Last 3 Encounters:  05/24/23 188 lb (85.3 kg)  05/19/23 186 lb 11.7 oz (84.7 kg)     GEN:  Well nourished, well developed in no acute distress HEENT: Normal NECK: No JVD; No carotid bruits LYMPHATICS: No lymphadenopathy CARDIAC: RRR, no murmurs, rubs, gallops RESPIRATORY:  Clear to auscultation without rales, wheezing or rhonchi  ABDOMEN: Soft, non-tender, non-distended MUSCULOSKELETAL:  No edema; No deformity  SKIN: Warm and dry NEUROLOGIC:  Alert and oriented x 3 PSYCHIATRIC:  Normal affect   ASSESSMENT:    1. Non-ST elevation (NSTEMI) myocardial infarction (HCC)   2. Coronary artery disease involving  native coronary artery of native heart without angina pectoris   3. Essential hypertension   4. Hyperlipidemia, mixed   5. Hypokalemia    PLAN:    In order of problems listed above:  NSTEMI CAD Recent admission for NSTEMI and elevated BP.  Left heart cath showed severely diseased RCA with complex plaque in the mid to distal portion involving the early bifurcation of the PDA and posterolateral branches.  Critical stenosis of the PLA branch was treated with DES placement and severe stenosis of the mid RCA before the bifurcation was treated successfully with DES placement.  Otherwise, cath showed patent LAD with nonobstructive plaquing, most significant stenosis is a 60% lesion in the distal vessel and diagonal branches are diffusely diseased, diffuse disease in the circumflex with 50% eccentric proximal stenosis and diffuse 75% distal stenosis, moderate caliber intermediate branch with 50% stenosis.  Patient was started on DAPT with aspirin and Brilinta x 12 months.  Patient is overall doing well from a cardiac standpoint.  He denies any anginal symptoms.  EKG today shows T wave inversions lead III and aVF with minimal ST changes in the lateral leads (J-point elevation); in the absence of symptoms I suspect residual changes since heart attack.  I will refer patient to cardiac rehab.  Continue aspirin 81 mg daily, Brilinta 90 mg twice daily, Coreg 6.25 mg twice daily and sublingual nitroglycerin.  Patient plans to go back to work on 11/4.  He is a Runner, broadcasting/film/video of motor vehicles, I will give him a note for this.  I will check a CBC and BMET today.  HTN BP is mildly elevated today. I will increase Losartan to 50mg  daily. Continue Coreg 6.25mg  BID.   HLD LDL 102. Continue Crestor 40mg  daily. Re-peat Lipids and Lfts at follow-up.   Hypokalemia K3.2 in the hospital.  Repeat BMET today.  Disposition: Follow up in 1 month(s) with Md/APP    Signed, Toshiye Kever David Stall, PA-C  05/24/2023 1:22 PM    [facility]

## 2023-05-25 ENCOUNTER — Ambulatory Visit: Payer: Medicare Other | Admitting: Nurse Practitioner

## 2023-05-25 ENCOUNTER — Telehealth: Payer: Self-pay | Admitting: Medical

## 2023-05-25 LAB — BASIC METABOLIC PANEL
BUN/Creatinine Ratio: 10 (ref 10–24)
BUN: 12 mg/dL (ref 8–27)
CO2: 24 mmol/L (ref 20–29)
Calcium: 9.7 mg/dL (ref 8.6–10.2)
Chloride: 99 mmol/L (ref 96–106)
Creatinine, Ser: 1.18 mg/dL (ref 0.76–1.27)
Glucose: 87 mg/dL (ref 70–99)
Potassium: 3.9 mmol/L (ref 3.5–5.2)
Sodium: 138 mmol/L (ref 134–144)
eGFR: 66 mL/min/{1.73_m2} (ref >59)

## 2023-05-25 LAB — CBC
Hematocrit: 40.5 % (ref 37.5–51.0)
Hemoglobin: 13 g/dL (ref 13.0–17.7)
MCH: 30.7 pg (ref 26.6–33.0)
MCHC: 32.1 g/dL (ref 31.5–35.7)
MCV: 96 fL (ref 79–97)
Platelets: 152 10*3/uL (ref 150–450)
RBC: 4.23 x10E6/uL (ref 4.14–5.80)
RDW: 11.9 % (ref 11.6–15.4)
WBC: 6.9 10*3/uL (ref 3.4–10.8)

## 2023-05-25 NOTE — Telephone Encounter (Signed)
Patient is returning call to discuss lab results. 

## 2023-05-25 NOTE — Telephone Encounter (Signed)
Called patient and notified him of the following from Cadence [place], [place].  Labs look good.   Patient verbalizes understanding.

## 2023-05-26 ENCOUNTER — Other Ambulatory Visit: Payer: Self-pay | Admitting: *Deleted

## 2023-05-26 ENCOUNTER — Telehealth: Payer: Self-pay | Admitting: *Deleted

## 2023-05-26 NOTE — Telephone Encounter (Signed)
Spoke with patient and reviewed necessary information we need to process his application. He verbalized understanding and will try to bring that information by later today. Instructed him to ask for Rinaldo Cloud when he comes in so I can get him to sign form as well.

## 2023-05-26 NOTE — Telephone Encounter (Signed)
Patient came in today to bring his income and sign application for assistance. Application completed, signed, and faxed to company

## 2023-05-30 ENCOUNTER — Encounter: Payer: Self-pay | Admitting: *Deleted

## 2023-05-31 ENCOUNTER — Ambulatory Visit: Payer: Medicare Other | Admitting: Cardiology

## 2023-06-13 ENCOUNTER — Other Ambulatory Visit (HOSPITAL_COMMUNITY): Payer: Self-pay

## 2023-06-21 ENCOUNTER — Other Ambulatory Visit (HOSPITAL_COMMUNITY): Payer: Self-pay

## 2023-06-21 ENCOUNTER — Telehealth: Payer: Self-pay | Admitting: Medical

## 2023-06-21 MED ORDER — ROSUVASTATIN CALCIUM 40 MG PO TABS: 40.0000 mg | ORAL_TABLET | Freq: Every day | ORAL | 1 refills | Status: DC

## 2023-06-21 NOTE — Telephone Encounter (Signed)
*  STAT* If patient is at the pharmacy, call can be transferred to refill team.   1. Which medications need to be refilled? (please list name of each medication and dose if known)  rosuvastatin (CRESTOR) 40 MG tablet  2. Which pharmacy/location (including street and city if local pharmacy) is medication to be sent to? [id number] - [place], [place] - [address] AT [facility] OF [address]  3. Do they need a 30 day or 90 day supply?  30 day supply  Patient states he contacted the pharmacy regarding picking up a refill and they informed him that the medication has been transferred. He states they did not tell him where it was transferred, but he would like to know if a new prescription can be sent in.

## 2023-06-21 NOTE — Telephone Encounter (Signed)
Requested Prescriptions   Signed Prescriptions Disp Refills   rosuvastatin (CRESTOR) 40 MG tablet 90 tablet 1    Sig: Take 1 tablet (40 mg total) by mouth daily.    Authorizing Provider: Marianne Sofia    Ordering User: Guerry Minors   Last visit 05/24/23 with plan to f/u in  1 months.    Next visit:  06/27/23

## 2023-06-27 ENCOUNTER — Ambulatory Visit: Payer: Medicare Other | Attending: Medical | Admitting: Medical

## 2023-06-27 ENCOUNTER — Encounter: Payer: Self-pay | Admitting: Medical

## 2023-06-27 VITALS — BP 112/70 | HR 64 | Ht 69.5 in | Wt 178.1 lb

## 2023-06-27 DIAGNOSIS — E876 Hypokalemia: Secondary | ICD-10-CM | POA: Diagnosis present

## 2023-06-27 DIAGNOSIS — Z79899 Other long term (current) drug therapy: Secondary | ICD-10-CM | POA: Diagnosis present

## 2023-06-27 DIAGNOSIS — I10 Essential (primary) hypertension: Secondary | ICD-10-CM

## 2023-06-27 DIAGNOSIS — E782 Mixed hyperlipidemia: Secondary | ICD-10-CM

## 2023-06-27 DIAGNOSIS — I2510 Atherosclerotic heart disease of native coronary artery without angina pectoris: Secondary | ICD-10-CM

## 2023-06-27 NOTE — Patient Instructions (Signed)
Medication Instructions:  Your physician recommends that you continue on your current medications as directed. Please refer to the Current Medication list given to you today.   *If you need a refill on your cardiac medications before your next appointment, please call your pharmacy*   Lab Work: Your provider would like for you to have following labs drawn today (Lipid, LFT).     Testing/Procedures: No test ordered today    Follow-Up: At [facility], you and your health needs are our priority.  As part of our continuing mission to provide you with exceptional heart care, we have created designated Provider Care Teams.  These Care Teams include your primary Cardiologist (physician) and Advanced Practice Providers (APPs -  Physician Assistants and Nurse Practitioners) who all work together to provide you with the care you need, when you need it.  We recommend signing up for the patient portal called "MyChart".  Sign up information is provided on this After Visit Summary.  MyChart is used to connect with patients for Virtual Visits (Telemedicine).  Patients are able to view lab/test results, encounter notes, upcoming appointments, etc.  Non-urgent messages can be sent to your provider as well.   To learn more about what you can do with MyChart, go to [email or web address].    Your next appointment:   4 month(s)  Provider:   Terrilee Croak, PA-C

## 2023-06-27 NOTE — Progress Notes (Signed)
Cardiology Office Note:    Date:  06/27/2023   ID:  Miguel Guzman, DOB [birth year], MRN [id number]  PCP:  [facility]  [facility] Cardiologist:  None  [facility] Electrophysiologist:  None   Referring MD: [facility]   Chief Complaint: 2 month follow-up  History of Present Illness:    Miguel Guzman is a 72 y.o. male with a hx of hypertension, CAD s/p DES PLA branch and DES mRCA, HLD  who presents for hospital follow-up.   Patient was admitted in late October 2024 with chest pain.  High-sensitivity troponin was elevated to the 400s and blood pressure was 215/97.  Left heart cath showed severely diseased RCA with complex plaquing in the mid to distal portion involving early bifurcation of the PDA and posterolateral branches.  Reciprocal stenosis of the PLA branch treated successfully with DES placement and severe stenosis of the mid RCA before the bifurcation point treated successfully reducing 90% stenosis to 0% stenosis with DES placement.  There was diffuse disease in the left circumflex with 50% eccentric proximal stenosis and diffuse 75% distal stenosis that was not intervened on.  Medical management was recommended for otherwise diffuse disease.  Patient was started on DAPT with aspirin and Brilinta for 12 months.  Patient was started on Crestor 40 mg daily, losartan 25 mg daily and Coreg 6.25 mg twice daily.  Echo showed EF 55 to 60%, grade 3 diastolic dysfunction, mild to moderate MR.  The patient was last seen 05/24/23 and was overall doing well from a cardiac perspective.   Today, the patient reports he has been doing well. It has been slow getting his stamina back up, it is almost normal. Dizziness may occur if he stands up too fast. He lost some weight, about 10 lbs. He is not interested in cardiac rehab since he recently started his job.   Past Medical History:  Diagnosis Date   Hypertension     Past Surgical History:  Procedure Laterality Date    CORONARY STENT INTERVENTION N/A 05/19/2023   Procedure: CORONARY STENT INTERVENTION;  Surgeon: Tonny Bollman, MD;  Location: [facility] INVASIVE CV LAB;  Service: Cardiovascular;  Laterality: N/A;   LEFT HEART CATH AND CORONARY ANGIOGRAPHY N/A 05/19/2023   Procedure: LEFT HEART CATH AND CORONARY ANGIOGRAPHY;  Surgeon: Tonny Bollman, MD;  Location: [facility] INVASIVE CV LAB;  Service: Cardiovascular;  Laterality: N/A;    Current Medications: Current Meds  Medication Sig   aspirin EC 81 MG tablet Take 1 tablet (81 mg total) by mouth daily. Swallow whole.   carvedilol (COREG) 6.25 MG tablet Take 1 tablet (6.25 mg total) by mouth 2 (two) times daily with a meal.   fluticasone (FLONASE) 50 MCG/ACT nasal spray Place into both nostrils daily as needed for allergies or rhinitis.   losartan (COZAAR) 50 MG tablet Take 1 tablet (50 mg total) by mouth daily.   nitroGLYCERIN (NITROSTAT) 0.4 MG SL tablet Place 1 tablet (0.4 mg total) under the tongue every 5 (five) minutes x 3 doses as needed for chest pain.   rosuvastatin (CRESTOR) 40 MG tablet Take 1 tablet (40 mg total) by mouth daily.   ticagrelor (BRILINTA) 90 MG TABS tablet Take 1 tablet (90 mg total) by mouth 2 (two) times daily.     Allergies:   Penicillins   Social History   Socioeconomic History   Marital status: Married    Spouse name: Not on file   Number of children: Not on file  Years of education: Not on file   Highest education level: Not on file  Occupational History   Not on file  Tobacco Use   Smoking status: Never   Smokeless tobacco: Never  Substance and Sexual Activity   Alcohol use: Never   Drug use: Never   Sexual activity: Not on file  Other Topics Concern   Not on file  Social History Narrative   Not on file   Social Determinants of Health   Financial Resource Strain: Not on file  Food Insecurity: No Food Insecurity (05/19/2023)   Hunger Vital Sign    Worried About Running Out of Food in the Last Year: Never true     Ran Out of Food in the Last Year: Never true  Transportation Needs: No Transportation Needs (05/19/2023)   PRAPARE - Administrator, Civil Service (Medical): No    Lack of Transportation (Non-Medical): No  Physical Activity: Not on file  Stress: Not on file  Social Connections: Not on file     Family History: The patient's family history includes Hypertension in his father and mother.  ROS:   Please see the history of present illness.     All other systems reviewed and are negative.  EKGs/Labs/Other Studies Reviewed:    The following studies were reviewed today:  Echo 04/2023 1. Left ventricular ejection fraction, by estimation, is 55 to 60%. The  left ventricle has normal function. The left ventricle has no regional  wall motion abnormalities. There is mild asymmetric left ventricular  hypertrophy of the infero-lateral  segment. Left ventricular diastolic parameters are consistent with Grade  III diastolic dysfunction (restrictive).   2. Right ventricular systolic function is normal. The right ventricular  size is normal. Tricuspid regurgitation signal is inadequate for assessing  PA pressure.   3. The mitral valve is normal in structure. Mild to moderate mitral valve  regurgitation.   4. The aortic valve is tricuspid. Aortic valve regurgitation is not  visualized.   5. The inferior vena cava is normal in size with greater than 50%  respiratory variability, suggesting right atrial pressure of 3 mmHg.   Comparison(s): A prior study was performed on 08/20/2021. Grade 1  diastolic dysfunction is now Grade 3, trivial MR is now mild/moderate,  otherwise no significant change.      LHC 04/2023 1.  Patent left main with no obstructive disease 2.  Patent LAD with nonobstructive plaquing, most significant stenosis is a 60% lesion in the distal vessel and the diagonal branches are diffusely diseased but appropriate for medical therapy 3.  Diffusely diseased circumflex  with 50% eccentric proximal stenosis and diffuse 75% distal stenosis 4.  Moderate caliber intermediate branch with 50% stenosis 5.  Severely diseased RCA with complex plaquing in the mid to distal portion involving the early bifurcation of the PDA and posterolateral branches.  Critical stenosis of the PLA branch treated successfully with a 2.75 x 16 mm Synergy DES and severe stenosis of the mid RCA before the bifurcation point treated successfully reducing a 90% stenosis to 0% with a 3.5 x 12 mm Synergy DES   Recommendations: 2D echo for assessment of LVEF, aggressive medical therapy for further risk reduction.  Could do PCI of the circumflex if recurrent ischemia, but favor medical therapy due to diffuse nature of disease.    EKG:  EKG is not ordered today.   Recent Labs: 05/19/2023: ALT 13; B Natriuretic Peptide 227.9 05/20/2023: TSH 1.848 05/24/2023: BUN 12; Creatinine, Ser  1.18; Hemoglobin 13.0; Platelets 152; Potassium 3.9; Sodium 138  Recent Lipid Panel    Component Value Date/Time   CHOL 148 05/20/2023 0221   TRIG 74 05/20/2023 0221   HDL 31 (L) 05/20/2023 0221   CHOLHDL 4.8 05/20/2023 0221   VLDL 15 05/20/2023 0221   LDLCALC 102 (H) 05/20/2023 0221    Physical Exam:    VS:  BP 112/70 (BP Location: Left Arm, Patient Position: Sitting, Cuff Size: Normal)   Pulse 64   Ht 5' 9.5" (1.765 m)   Wt 178 lb 2 oz (80.8 kg)   SpO2 95%   BMI 25.93 kg/m     Wt Readings from Last 3 Encounters:  06/27/23 178 lb 2 oz (80.8 kg)  05/24/23 188 lb (85.3 kg)  05/19/23 186 lb 11.7 oz (84.7 kg)     GEN:  Well nourished, well developed in no acute distress HEENT: Normal NECK: No JVD; No carotid bruits LYMPHATICS: No lymphadenopathy CARDIAC: RRR, no murmurs, rubs, gallops RESPIRATORY:  Clear to auscultation without rales, wheezing or rhonchi  ABDOMEN: Soft, non-tender, non-distended MUSCULOSKELETAL:  No edema; No deformity  SKIN: Warm and dry NEUROLOGIC:  Alert and oriented x  3 PSYCHIATRIC:  Normal affect   ASSESSMENT:    1. Coronary artery disease involving native coronary artery of native heart without angina pectoris   2. Essential hypertension   3. Hypokalemia   4. Hyperlipidemia, mixed   5. Medication management    PLAN:    In order of problems listed above:  CAD s/p mRCA 04/2023 Patient is overall doing well from a cardiac standpoint.  He reports he is slowly gaining stamina, however he is not back to baseline.  Patient is unable to do cardiac rehab given he recently started a new job and is still on probation period.  Continue DAPT x 12 months.  Can continue aspirin 81 mg daily, Brilinta 90 mg twice daily, Coreg 6.25 mg twice daily and sublingual nitroglycerin.  No further ischemic workup at this time.  HTN Blood pressure is much better today. He reports a 10 pound weight loss from diet changes.  Continue losartan 50 mg daily and Coreg 6.25 mg twice daily.  HLD LDL 102.  Repeat lipids and LFTs today.  Continue Crestor 40 mg daily.  Hypokalemia Resolved on most recent labs.  Disposition: Follow up in 4 month(s) with MD/APP     Signed, Geanette Buonocore David Stall, PA-C  06/27/2023 9:03 AM    [facility]

## 2023-06-28 LAB — HEPATIC FUNCTION PANEL
ALT: 18 [IU]/L (ref 0–44)
AST: 28 [IU]/L (ref 0–40)
Albumin: 4.1 g/dL (ref 3.8–4.8)
Alkaline Phosphatase: 96 [IU]/L (ref 44–121)
Bilirubin Total: 0.5 mg/dL (ref 0.0–1.2)
Bilirubin, Direct: 0.19 mg/dL (ref 0.00–0.40)
Total Protein: 7.8 g/dL (ref 6.0–8.5)

## 2023-06-28 LAB — LIPID PANEL
Chol/HDL Ratio: 2.7 {ratio} (ref 0.0–5.0)
Cholesterol, Total: 90 mg/dL — ABNORMAL LOW (ref 100–199)
HDL: 33 mg/dL — ABNORMAL LOW (ref >39)
LDL Chol Calc (NIH): 43 mg/dL (ref 0–99)
Triglycerides: 61 mg/dL (ref 0–149)
VLDL Cholesterol Cal: 14 mg/dL (ref 5–40)

## 2023-09-24 ENCOUNTER — Other Ambulatory Visit: Payer: Self-pay | Admitting: Medical

## 2023-09-24 ENCOUNTER — Other Ambulatory Visit: Payer: Self-pay | Admitting: Cardiology

## 2023-11-02 ENCOUNTER — Ambulatory Visit: Payer: Medicare Other | Attending: Medical | Admitting: Medical

## 2023-11-02 ENCOUNTER — Encounter: Payer: Self-pay | Admitting: Medical

## 2023-11-02 VITALS — BP 136/84 | HR 61 | Ht 69.5 in | Wt 179.2 lb

## 2023-11-02 DIAGNOSIS — I10 Essential (primary) hypertension: Secondary | ICD-10-CM

## 2023-11-02 DIAGNOSIS — E782 Mixed hyperlipidemia: Secondary | ICD-10-CM

## 2023-11-02 DIAGNOSIS — I2510 Atherosclerotic heart disease of native coronary artery without angina pectoris: Secondary | ICD-10-CM | POA: Diagnosis not present

## 2023-11-02 NOTE — Patient Instructions (Signed)
 Medication Instructions:  Your physician recommends that you continue on your current medications as directed. Please refer to the Current Medication list given to you today.   *If you need a refill on your cardiac medications before your next appointment, please call your pharmacy*  Lab Work: No labs ordered today   Testing/Procedures: No test ordered today   Follow-Up: At [facility], you and your health needs are our priority.  As part of our continuing mission to provide you with exceptional heart care, our providers are all part of one team.  This team includes your primary Cardiologist (physician) and Advanced Practice Providers or APPs (Physician Assistants and Nurse Practitioners) who all work together to provide you with the care you need, when you need it.  Your next appointment:   4 month(s)  Provider:   Cadence Fransico Michael, PA-C   We recommend signing up for the patient portal called "MyChart".  Sign up information is provided on this After Visit Summary.  MyChart is used to connect with patients for Virtual Visits (Telemedicine).  Patients are able to view lab/test results, encounter notes, upcoming appointments, etc.  Non-urgent messages can be sent to your provider as well.   To learn more about what you can do with MyChart, go to [email or web address].

## 2023-11-02 NOTE — Progress Notes (Signed)
 Cardiology Office Note:  .   Date:  11/02/2023  ID:  Miguel Guzman, DOB Mar 03, 1951, MRN [id number] PCP: [facility]  [facility] Providers Cardiologist:  None {  History of Present Illness: .   Miguel Guzman is a 73 y.o. male with a hx of hypertension, CAD s/p DES PLA branch and DES mRCA, HLD, and HTN  who presents for 3 month follow-up.   Patient was admitted in late October 2024 with chest pain.  High-sensitivity troponin was elevated to the 400s and blood pressure was 215/97.  Left heart cath showed severely diseased RCA with complex plaquing in the mid to distal portion involving early bifurcation of the PDA and posterolateral branches.  Reciprocal stenosis of the PLA branch treated successfully with DES placement and severe stenosis of the mid RCA before the bifurcation point treated successfully reducing 90% stenosis to 0% stenosis with DES placement.  There was diffuse disease in the left circumflex with 50% eccentric proximal stenosis and diffuse 75% distal stenosis that was not intervened on.  Medical management was recommended for otherwise diffuse disease.  Patient was started on DAPT with aspirin and Brilinta for 12 months.  Patient was started on Crestor 40 mg daily, losartan 25 mg daily and Coreg 6.25 mg twice daily.  Echo showed EF 55 to 60%, grade 3 diastolic dysfunction, mild to moderate MR.  The patient was last seen 06/27/23 and was overall doing well from a cardiac perspective.   Today, the patient reports he is overall doing well. He has good and bad days. Has some abdominal and back pain that seems to be worse with activity. He denies chest pain, SOB, lower leg edema, palpitations, heart racing, orthopnea or pnd. He has rare lightheadedness and dizziness. He walks all day long at work.   Studies Reviewed: .       Echo 04/2023 1. Left ventricular ejection fraction, by estimation, is 55 to 60%. The  left ventricle has normal function. The left ventricle has  no regional  wall motion abnormalities. There is mild asymmetric left ventricular  hypertrophy of the infero-lateral  segment. Left ventricular diastolic parameters are consistent with Grade  III diastolic dysfunction (restrictive).   2. Right ventricular systolic function is normal. The right ventricular  size is normal. Tricuspid regurgitation signal is inadequate for assessing  PA pressure.   3. The mitral valve is normal in structure. Mild to moderate mitral valve  regurgitation.   4. The aortic valve is tricuspid. Aortic valve regurgitation is not  visualized.   5. The inferior vena cava is normal in size with greater than 50%  respiratory variability, suggesting right atrial pressure of 3 mmHg.   Comparison(s): A prior study was performed on 08/20/2021. Grade 1  diastolic dysfunction is now Grade 3, trivial MR is now mild/moderate,  otherwise no significant change.      LHC 04/2023 1.  Patent left main with no obstructive disease 2.  Patent LAD with nonobstructive plaquing, most significant stenosis is a 60% lesion in the distal vessel and the diagonal branches are diffusely diseased but appropriate for medical therapy 3.  Diffusely diseased circumflex with 50% eccentric proximal stenosis and diffuse 75% distal stenosis 4.  Moderate caliber intermediate branch with 50% stenosis 5.  Severely diseased RCA with complex plaquing in the mid to distal portion involving the early bifurcation of the PDA and posterolateral branches.  Critical stenosis of the PLA branch treated successfully with a 2.75 x 16 mm Synergy  DES and severe stenosis of the mid RCA before the bifurcation point treated successfully reducing a 90% stenosis to 0% with a 3.5 x 12 mm Synergy DES   Recommendations: 2D echo for assessment of LVEF, aggressive medical therapy for further risk reduction.  Could do PCI of the circumflex if recurrent ischemia, but favor medical therapy due to diffuse nature of disease.    Physical Exam:   VS:  BP 136/84   Pulse 61   Ht 5' 9.5" (1.765 m)   Wt 179 lb 3.2 oz (81.3 kg)   SpO2 98%   BMI 26.08 kg/m    Wt Readings from Last 3 Encounters:  11/02/23 179 lb 3.2 oz (81.3 kg)  06/27/23 178 lb 2 oz (80.8 kg)  05/24/23 188 lb (85.3 kg)    GEN: Well nourished, well developed in no acute distress NECK: No JVD; No carotid bruits CARDIAC: RRR, no murmurs, rubs, gallops RESPIRATORY:  Clear to auscultation without rales, wheezing or rhonchi  ABDOMEN: Soft, non-tender, non-distended EXTREMITIES:  No edema; No deformity   ASSESSMENT AND PLAN: .    CAD s/p DES PLA branch 04/2023 Cardiac cath showed severely diseased RCA with complex plaque in the mid to distal portio involving the early bifurcation of the PDA and posterolateral branches, critical stenosis of the PLA branch treated with DES placement, patent LAD with nonobstructive plaquing, diffuse circumflex disease, moderate caliber intermediate branch with 50% stenosis. Medcial therapy recommended for other CAD. The patient is overall doing well from a cardiac perspective. He denies chest pain or SOB. He is still working and walks all day at work. He does not do heavy lifting. He has occasional abdominal and back discomfort that feels like MSK soreness after activity. Continue DAPT with ASA and Brilinta. No bleeding issues reported. Continue Coreg 6.25mg  BID, Losartan 50mg  daily, Crestor 40mg  daily and SL NTG.   HTN BP is reasonable at 136/84. Continue Losartan 50mg  daily and Coreg 6.25mg  BID.   HLD LDL 43. Continue Crestor 40mg  daily.      Dispo: Follow-up in 4 months  Signed, Makaylen Thieme David Stall, PA-C

## 2024-02-27 ENCOUNTER — Encounter: Payer: Self-pay | Admitting: *Deleted

## 2024-03-05 ENCOUNTER — Encounter: Payer: Self-pay | Admitting: Medical

## 2024-03-05 ENCOUNTER — Ambulatory Visit: Attending: Medical | Admitting: Medical

## 2024-03-05 VITALS — BP 130/60 | HR 56 | Ht 69.0 in | Wt 175.8 lb

## 2024-03-05 DIAGNOSIS — I10 Essential (primary) hypertension: Secondary | ICD-10-CM | POA: Insufficient documentation

## 2024-03-05 DIAGNOSIS — I2510 Atherosclerotic heart disease of native coronary artery without angina pectoris: Secondary | ICD-10-CM | POA: Diagnosis not present

## 2024-03-05 DIAGNOSIS — E782 Mixed hyperlipidemia: Secondary | ICD-10-CM | POA: Insufficient documentation

## 2024-03-05 MED ORDER — ASPIRIN 81 MG PO TBEC: 81.0000 mg | DELAYED_RELEASE_TABLET | Freq: Every day | ORAL | 3 refills | Status: AC

## 2024-03-05 MED ORDER — CARVEDILOL 6.25 MG PO TABS: 6.2500 mg | ORAL_TABLET | Freq: Two times a day (BID) | ORAL | 3 refills | Status: AC

## 2024-03-05 MED ORDER — ROSUVASTATIN CALCIUM 40 MG PO TABS: 40.0000 mg | ORAL_TABLET | Freq: Every day | ORAL | 3 refills | Status: AC

## 2024-03-05 MED ORDER — NITROGLYCERIN 0.4 MG SL SUBL: 0.4000 mg | SUBLINGUAL_TABLET | SUBLINGUAL | 3 refills | Status: AC | PRN

## 2024-03-05 MED ORDER — LOSARTAN POTASSIUM 50 MG PO TABS: 50.0000 mg | ORAL_TABLET | Freq: Every day | ORAL | 3 refills | Status: AC

## 2024-03-05 NOTE — Patient Instructions (Signed)
 Medication Instructions:  Your physician recommends that you continue on your current medications as directed. Please refer to the Current Medication list given to you today.    *If you need a refill on your cardiac medications before your next appointment, please call your pharmacy*  Lab Work: No labs ordered today    Testing/Procedures: No test ordered today   Follow-Up: At [facility], you and your health needs are our priority.  As part of our continuing mission to provide you with exceptional heart care, our providers are all part of one team.  This team includes your primary Cardiologist (physician) and Advanced Practice Providers or APPs (Physician Assistants and Nurse Practitioners) who all work together to provide you with the care you need, when you need it.  Your next appointment:   3 month(s)  Provider:   Toribio Frees, PA-C

## 2024-03-05 NOTE — Progress Notes (Signed)
 Cardiology Office Note   Date:  03/05/2024  ID:  DECARLO RIVET, DOB 1950-08-25, MRN [id number] PCP: [facility]  [facility] Providers Cardiologist:  None    History of Present Illness Miguel Guzman is a 73 y.o. male  with a hx of hypertension, CAD s/p DES PLA branch and DES mRCA, HLD, and HTN  who presents for 3 month follow-up.   Patient was admitted in late October 2024 with chest pain.  High-sensitivity troponin was elevated to the 400s and blood pressure was 215/97.  Left heart cath showed severely diseased RCA with complex plaquing in the mid to distal portion involving early bifurcation of the PDA and posterolateral branches.  Reciprocal stenosis of the PLA branch treated successfully with DES placement and severe stenosis of the mid RCA before the bifurcation point treated successfully reducing 90% stenosis to 0% stenosis with DES placement.  There was diffuse disease in the left circumflex with 50% eccentric proximal stenosis and diffuse 75% distal stenosis that was not intervened on.  Medical management was recommended for otherwise diffuse disease.  Patient was started on DAPT with aspirin  and Brilinta  for 12 months.  Patient was started on Crestor  40 mg daily, losartan  25 mg daily and Coreg  6.25 mg twice daily.  Echo showed EF 55 to 60%, grade 3 diastolic dysfunction, mild to moderate MR.  The patient was last seen 11/02/23 and was overall doing well from a cardiac perspective.  Today, the patient is overall doing well. He denies chest pain or shortness of breath. BP is good, but he says it's normally lower. He did have a stressful event at work, but this is improving. He remains active. Diet is good.   Studies Reviewed EKG Interpretation Date/Time:  Monday March 05 2024 08:05:43 EDT Ventricular Rate:  56 PR Interval:  178 QRS Duration:  82 QT Interval:  444 QTC Calculation: 428 R Axis:   62  Text Interpretation: Sinus bradycardia When compared with ECG of  24-May-2023 09:03, ST no longer depressed in Inferior leads T wave inversion less evident in Inferior leads T wave amplitude has decreased in Lateral leads Confirmed by Franchester, Ajeet Casasola ([id number]) on 03/05/2024 8:12:45 AM     Echo 04/2023 1. Left ventricular ejection fraction, by estimation, is 55 to 60%. The  left ventricle has normal function. The left ventricle has no regional  wall motion abnormalities. There is mild asymmetric left ventricular  hypertrophy of the infero-lateral  segment. Left ventricular diastolic parameters are consistent with Grade  III diastolic dysfunction (restrictive).   2. Right ventricular systolic function is normal. The right ventricular  size is normal. Tricuspid regurgitation signal is inadequate for assessing  PA pressure.   3. The mitral valve is normal in structure. Mild to moderate mitral valve  regurgitation.   4. The aortic valve is tricuspid. Aortic valve regurgitation is not  visualized.   5. The inferior vena cava is normal in size with greater than 50%  respiratory variability, suggesting right atrial pressure of 3 mmHg.   Comparison(s): A prior study was performed on 08/20/2021. Grade 1  diastolic dysfunction is now Grade 3, trivial MR is now mild/moderate,  otherwise no significant change.      LHC 04/2023 1.  Patent left main with no obstructive disease 2.  Patent LAD with nonobstructive plaquing, most significant stenosis is a 60% lesion in the distal vessel and the diagonal branches are diffusely diseased but appropriate for medical therapy 3.  Diffusely diseased circumflex with  50% eccentric proximal stenosis and diffuse 75% distal stenosis 4.  Moderate caliber intermediate branch with 50% stenosis 5.  Severely diseased RCA with complex plaquing in the mid to distal portion involving the early bifurcation of the PDA and posterolateral branches.  Critical stenosis of the PLA branch treated successfully with a 2.75 x 16 mm Synergy DES and severe  stenosis of the mid RCA before the bifurcation point treated successfully reducing a 90% stenosis to 0% with a 3.5 x 12 mm Synergy DES   Recommendations: 2D echo for assessment of LVEF, aggressive medical therapy for further risk reduction.  Could do PCI of the circumflex if recurrent ischemia, but favor medical therapy due to diffuse nature of disease.     Physical Exam VS:  BP 130/60 (BP Location: Left Arm, Patient Position: Sitting, Cuff Size: Normal)   Pulse (!) 56   Ht 5' 9 (1.753 m)   Wt 175 lb 12.8 oz (79.7 kg)   SpO2 100%   BMI 25.96 kg/m        Wt Readings from Last 3 Encounters:  03/05/24 175 lb 12.8 oz (79.7 kg)  11/02/23 179 lb 3.2 oz (81.3 kg)  06/27/23 178 lb 2 oz (80.8 kg)    GEN: Well nourished, well developed in no acute distress NECK: No JVD; No carotid bruits CARDIAC: RRR, no murmurs, rubs, gallops RESPIRATORY:  Clear to auscultation without rales, wheezing or rhonchi  ABDOMEN: Soft, non-tender, non-distended EXTREMITIES:  No edema; No deformity   ASSESSMENT AND PLAN  CAD s/p DES PLA branch 04/2023 Cardiac cath showed severely diseased RCA with complex plaque in the mid to distal portion involving the early bifurcation of the PDA and posterior lateral branches, critical stenosis of the PLA branch treated with DES placement, patent LAD with nonobstructive plaquing, diffuse circumflex disease, moderate caliber intermediate branch with 50% stenosis.  The patient was started on aspirin and DAPT for at least 12 months.  Patient is overall doing well from a cardiac perspective.  He denies anginal symptoms.  Patient remains active with an overall healthy lifestyle.  We will continue aspirin, Brilinta, Coreg, losartan, Crestor, and sublingual nitroglycerin.  Refills of cardiac medications today.  At follow-up can stop Brilinta as it will be a year post stent.  HLD LDL 43.  Continue Crestor 40 mg daily.  HTN Blood pressure is good today.  Continue Coreg 6.25 mg twice  daily and losartan 50 mg daily.     Dispo: Follow-up in 3 to 4 months  Signed, Makennah Omura VEAR Fishman, PA-C

## 2024-06-05 ENCOUNTER — Encounter: Payer: Self-pay | Admitting: Medical

## 2024-06-05 ENCOUNTER — Ambulatory Visit: Attending: Medical | Admitting: Medical

## 2024-06-05 VITALS — BP 183/79 | HR 56 | Ht 69.0 in | Wt 174.0 lb

## 2024-06-05 DIAGNOSIS — E782 Mixed hyperlipidemia: Secondary | ICD-10-CM | POA: Diagnosis not present

## 2024-06-05 DIAGNOSIS — I10 Essential (primary) hypertension: Secondary | ICD-10-CM

## 2024-06-05 DIAGNOSIS — I2510 Atherosclerotic heart disease of native coronary artery without angina pectoris: Secondary | ICD-10-CM | POA: Diagnosis not present

## 2024-06-05 NOTE — Patient Instructions (Signed)
 Medication Instructions:  Your physician recommends the following medication changes.  STOP TAKING: Brilinta     *If you need a refill on your cardiac medications before your next appointment, please call your pharmacy*  Lab Work: No labs ordered today    Testing/Procedures: No test ordered today   Follow-Up: At [facility], you and your health needs are our priority.  As part of our continuing mission to provide you with exceptional heart care, our providers are all part of one team.  This team includes your primary Cardiologist (physician) and Advanced Practice Providers or APPs (Physician Assistants and Nurse Practitioners) who all work together to provide you with the care you need, when you need it.  Your next appointment:   6 month(s)  Provider:   Mikey Fishman, PA-C

## 2024-06-05 NOTE — Progress Notes (Signed)
 Cardiology Office Note   Date:  06/05/2024  ID:  Miguel Guzman, DOB 13-Nov-1950, MRN [id number] PCP: [facility]  [facility] Providers Cardiologist:  None     History of Present Illness Miguel Guzman is a 73 y.o. male with a hx of hypertension, CAD s/p DES PLA branch and DES mRCA 04/2023, HLD, and HTN  who presents for 3 month follow-up.   Patient was admitted in late October 2024 with chest pain.  High-sensitivity troponin was elevated to the 400s and blood pressure was 215/97.  Left heart cath showed severely diseased RCA with complex plaquing in the mid to distal portion involving early bifurcation of the PDA and posterolateral branches.  Reciprocal stenosis of the PLA branch treated successfully with DES placement and severe stenosis of the mid RCA before the bifurcation point treated successfully reducing 90% stenosis to 0% stenosis with DES placement.  There was diffuse disease in the left circumflex with 50% eccentric proximal stenosis and diffuse 75% distal stenosis that was not intervened on.  Medical management was recommended for otherwise diffuse disease.  Patient was started on DAPT with aspirin  and Brilinta  for 12 months.  Patient was started on Crestor  40 mg daily, losartan  25 mg daily and Coreg  6.25 mg twice daily.  Echo showed EF 55 to 60%, grade 3 diastolic dysfunction, mild to moderate MR.  The patient was last seen 02/2024 and was stable from a cardiac perspective.   Today, the patient is overall doing well. He has not had Brilinta  since August, plan to stop this visit since he is one year post stent.  Blood pressure is very high, however he did not have BP medications this AM. He denies chest pain, SOB, lower leg edema, lightheadedness, dizziness. He is staying active and eating good.   Studies Reviewed      Echo 04/2023 1. Left ventricular ejection fraction, by estimation, is 55 to 60%. The  left ventricle has normal function. The left ventricle has  no regional  wall motion abnormalities. There is mild asymmetric left ventricular  hypertrophy of the infero-lateral  segment. Left ventricular diastolic parameters are consistent with Grade  III diastolic dysfunction (restrictive).   2. Right ventricular systolic function is normal. The right ventricular  size is normal. Tricuspid regurgitation signal is inadequate for assessing  PA pressure.   3. The mitral valve is normal in structure. Mild to moderate mitral valve  regurgitation.   4. The aortic valve is tricuspid. Aortic valve regurgitation is not  visualized.   5. The inferior vena cava is normal in size with greater than 50%  respiratory variability, suggesting right atrial pressure of 3 mmHg.   Comparison(s): A prior study was performed on 08/20/2021. Grade 1  diastolic dysfunction is now Grade 3, trivial MR is now mild/moderate,  otherwise no significant change.      LHC 04/2023 1.  Patent left main with no obstructive disease 2.  Patent LAD with nonobstructive plaquing, most significant stenosis is a 60% lesion in the distal vessel and the diagonal branches are diffusely diseased but appropriate for medical therapy 3.  Diffusely diseased circumflex with 50% eccentric proximal stenosis and diffuse 75% distal stenosis 4.  Moderate caliber intermediate branch with 50% stenosis 5.  Severely diseased RCA with complex plaquing in the mid to distal portion involving the early bifurcation of the PDA and posterolateral branches.  Critical stenosis of the PLA branch treated successfully with a 2.75 x 16 mm Synergy DES and severe  stenosis of the mid RCA before the bifurcation point treated successfully reducing a 90% stenosis to 0% with a 3.5 x 12 mm Synergy DES   Recommendations: 2D echo for assessment of LVEF, aggressive medical therapy for further risk reduction.  Could do PCI of the circumflex if recurrent ischemia, but favor medical therapy due to diffuse nature of disease.       Physical Exam VS:  BP (!) 183/79 (BP Location: Left Arm, Patient Position: Sitting, Cuff Size: Normal)   Pulse (!) 56   Ht 5' 9 (1.753 m)   Wt 174 lb (78.9 kg)   SpO2 99%   BMI 25.70 kg/m        Wt Readings from Last 3 Encounters:  06/05/24 174 lb (78.9 kg)  03/05/24 175 lb 12.8 oz (79.7 kg)  11/02/23 179 lb 3.2 oz (81.3 kg)    GEN: Well nourished, well developed in no acute distress NECK: No JVD; No carotid bruits CARDIAC: bradycardia, RR, no murmurs, rubs, gallops RESPIRATORY:  Clear to auscultation without rales, wheezing or rhonchi  ABDOMEN: Soft, non-tender, non-distended EXTREMITIES:  No edema; No deformity   ASSESSMENT AND PLAN  CAD s/p DES PLA branch and mRCA 04/2023 Cardiac cath in October 2024 showed severely diseased RCA with complex plaque in the mid to distal portion involving the early bifurcation of the PDA and posterolateral branches, critical stenosis of the PLA branch treated with DES placement, severe stenosis of the mid RCA treated with DES placement, patent LAD with nonobstructive plaquing, patent left main with no obstructive disease, diffuse circumflex disease, moderate caliber intermediate branch with 50% stenosis.  Medical therapy for circumflex disease.  Patient was started on DAPT with aspirin  and Brilinta .  It has been 12 months stent.  He said he was unable to get Brilinta  since August of this year.  We will stop Brilinta  at this time since it has been 1 year since stenting.  Will continue aspirin .  Patient denies any chest pain or shortness of breath.  Patient remains active with an overall healthy lifestyle.  We will continue aspirin , Coreg , losartan , Crestor , and sublingual nitroglycerin .  Hyperlipidemia LDL 43.  Continue Crestor  40 mg daily  Hypertension Blood pressure is elevated today, however he has not had his medications this morning.  He will go home and take Coreg  and losartan .  Continue Coreg  6.25 mg twice daily and losartan  50 mg daily.        Dispo: Follow-up in 6 months  Signed, Maylene Crocker VEAR Fishman, PA-C

## 2024-12-03 ENCOUNTER — Ambulatory Visit: Admitting: Medical
